# Patient Record
Sex: Male | Born: 2000 | Hispanic: Refuse to answer | Marital: Single | Smoking: Never smoker
Health system: Southern US, Community
[De-identification: ages and names within clinical notes are randomized; demographics above are authoritative.]

## PROBLEM LIST (undated history)

## (undated) ENCOUNTER — Inpatient Hospital Stay: Payer: Self-pay | Admitting: Pediatrics

## (undated) DIAGNOSIS — I319 Disease of pericardium, unspecified: Secondary | ICD-10-CM

## (undated) DIAGNOSIS — I514 Myocarditis, unspecified: Secondary | ICD-10-CM

---
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
## 2018-08-24 ENCOUNTER — Other Ambulatory Visit: Payer: Self-pay

## 2018-08-24 DIAGNOSIS — Z20822 Contact with and (suspected) exposure to covid-19: Secondary | ICD-10-CM

## 2018-08-25 LAB — NOVEL CORONAVIRUS, NAA: SARS-CoV-2, NAA: NOT DETECTED

## 2018-11-10 ENCOUNTER — Other Ambulatory Visit: Payer: Self-pay

## 2018-11-10 DIAGNOSIS — Z20822 Contact with and (suspected) exposure to covid-19: Secondary | ICD-10-CM

## 2018-11-12 LAB — NOVEL CORONAVIRUS, NAA: SARS-CoV-2, NAA: NOT DETECTED

## 2019-01-18 ENCOUNTER — Ambulatory Visit: Payer: 59 | Attending: Internal Medicine

## 2019-01-18 DIAGNOSIS — Z20822 Contact with and (suspected) exposure to covid-19: Secondary | ICD-10-CM

## 2019-01-19 LAB — NOVEL CORONAVIRUS, NAA: SARS-CoV-2, NAA: NOT DETECTED

## 2019-01-27 ENCOUNTER — Ambulatory Visit: Payer: 59 | Attending: Internal Medicine

## 2019-01-27 DIAGNOSIS — Z20822 Contact with and (suspected) exposure to covid-19: Secondary | ICD-10-CM

## 2019-01-28 ENCOUNTER — Other Ambulatory Visit: Payer: 59

## 2019-01-28 LAB — NOVEL CORONAVIRUS, NAA: SARS-CoV-2, NAA: NOT DETECTED

## 2019-04-09 ENCOUNTER — Ambulatory Visit: Payer: 59

## 2019-04-09 ENCOUNTER — Ambulatory Visit: Payer: 59 | Attending: Internal Medicine

## 2019-04-09 DIAGNOSIS — Z23 Encounter for immunization: Secondary | ICD-10-CM

## 2019-04-09 NOTE — Progress Notes (Signed)
   Covid-19 Vaccination Clinic  Name:  MANASES ETCHISON    MRN: [id number] DOB: [birth year]  04/09/2019  Mr. Steidle was observed post Covid-19 immunization for 15 minutes without incident. He was provided with Vaccine Information Sheet and instruction to access the V-Safe system.   Mr. Printup was instructed to call 911 with any severe reactions post vaccine: Marland Kitchen Difficulty breathing  . Swelling of face and throat  . A fast heartbeat  . A bad rash all over body  . Dizziness and weakness   Immunizations Administered    Name Date Dose VIS Date Route   Pfizer COVID-19 Vaccine 04/09/2019 10:05 AM 0.3 mL 12/25/2018 Intramuscular   Manufacturer: [employer], [employer]   Lot: [id number]   NDC: [address]-8

## 2019-05-03 ENCOUNTER — Ambulatory Visit: Payer: Self-pay

## 2019-05-04 ENCOUNTER — Ambulatory Visit: Payer: Self-pay

## 2019-05-13 ENCOUNTER — Ambulatory Visit: Payer: 59

## 2019-06-09 ENCOUNTER — Ambulatory Visit: Payer: 59 | Attending: Internal Medicine

## 2019-06-09 DIAGNOSIS — Z23 Encounter for immunization: Secondary | ICD-10-CM

## 2019-06-09 NOTE — Progress Notes (Signed)
   Covid-19 Vaccination Clinic  Name:  DAQUAWN SEELMAN    MRN: [id number] DOB: 03-24-2000  06/09/2019  Mr. Culbreath was observed post Covid-19 immunization for 15 minutes without incident. He was provided with Vaccine Information Sheet and instruction to access the V-Safe system.   Mr. Ellner was instructed to call 911 with any severe reactions post vaccine: Marland Kitchen Difficulty breathing  . Swelling of face and throat  . A fast heartbeat  . A bad rash all over body  . Dizziness and weakness   Immunizations Administered    Name Date Dose VIS Date Route   Pfizer COVID-19 Vaccine 06/09/2019  4:15 PM 0.3 mL 03/10/2018 Intramuscular   Manufacturer: [employer], [employer]   Lot: [id number]   NDC: [address]-0

## 2019-08-13 ENCOUNTER — Encounter (HOSPITAL_COMMUNITY): Payer: Self-pay | Admitting: Emergency Medicine

## 2019-08-13 ENCOUNTER — Emergency Department (HOSPITAL_COMMUNITY)
Admission: EM | Admit: 2019-08-13 | Discharge: 2019-08-13 | Disposition: A | Discharge status: Home / Self Care | Payer: 59 | Attending: Emergency Medicine | Admitting: Emergency Medicine

## 2019-08-13 DIAGNOSIS — R1084 Generalized abdominal pain: Secondary | ICD-10-CM | POA: Insufficient documentation

## 2019-08-13 DIAGNOSIS — K920 Hematemesis: Secondary | ICD-10-CM | POA: Insufficient documentation

## 2019-08-13 DIAGNOSIS — Z5321 Procedure and treatment not carried out due to patient leaving prior to being seen by health care provider: Secondary | ICD-10-CM | POA: Diagnosis not present

## 2019-08-13 LAB — COMPREHENSIVE METABOLIC PANEL
ALT: 61 U/L — ABNORMAL HIGH (ref 0–44)
AST: 31 U/L (ref 15–41)
Albumin: 5 g/dL (ref 3.5–5.0)
Alkaline Phosphatase: 95 U/L (ref 38–126)
Anion gap: 15 (ref 5–15)
BUN: 12 mg/dL (ref 6–20)
CO2: 21 mmol/L — ABNORMAL LOW (ref 22–32)
Calcium: 9.5 mg/dL (ref 8.9–10.3)
Chloride: 102 mmol/L (ref 98–111)
Creatinine, Ser: 1.01 mg/dL (ref 0.61–1.24)
GFR calc Af Amer: >60 mL/min (ref >60)
GFR calc non Af Amer: >60 mL/min (ref >60)
Glucose, Bld: 72 mg/dL (ref 70–99)
Potassium: 3.4 mmol/L — ABNORMAL LOW (ref 3.5–5.1)
Sodium: 138 mmol/L (ref 135–145)
Total Bilirubin: 1.6 mg/dL — ABNORMAL HIGH (ref 0.3–1.2)
Total Protein: 8.2 g/dL — ABNORMAL HIGH (ref 6.5–8.1)

## 2019-08-13 LAB — CBC
HCT: 49.9 % (ref 39.0–52.0)
Hemoglobin: 16.7 g/dL (ref 13.0–17.0)
MCH: 29 pg (ref 26.0–34.0)
MCHC: 33.5 g/dL (ref 30.0–36.0)
MCV: 86.8 fL (ref 80.0–100.0)
Platelets: 378 10*3/uL (ref 150–400)
RBC: 5.75 MIL/uL (ref 4.22–5.81)
RDW: 12.4 % (ref 11.5–15.5)
WBC: 13.1 10*3/uL — ABNORMAL HIGH (ref 4.0–10.5)
nRBC: 0 % (ref 0.0–0.2)

## 2019-08-13 LAB — URINALYSIS, ROUTINE W REFLEX MICROSCOPIC
Bacteria, UA: NONE SEEN
Glucose, UA: NEGATIVE mg/dL
Hgb urine dipstick: NEGATIVE
Ketones, ur: 20 mg/dL — AB
Leukocytes,Ua: NEGATIVE
Nitrite: NEGATIVE
Protein, ur: 100 mg/dL — AB
Specific Gravity, Urine: 1.034 — ABNORMAL HIGH (ref 1.005–1.030)
pH: 5 (ref 5.0–8.0)

## 2019-08-13 LAB — LIPASE, BLOOD: Lipase: 19 U/L (ref 11–51)

## 2019-08-13 MED ORDER — SODIUM CHLORIDE 0.9% FLUSH: 3.0000 mL | Freq: Once | INTRAVENOUS | Status: DC

## 2019-08-13 NOTE — ED Triage Notes (Signed)
Sent by [facility] for further eval of gen abd pain, N/V, hematemesis X1 week.

## 2019-08-13 NOTE — ED Notes (Signed)
Pt checked out AMA. 

## 2019-08-14 ENCOUNTER — Encounter: Payer: Self-pay | Admitting: Emergency Medicine

## 2019-08-14 ENCOUNTER — Emergency Department
Admission: EM | Admit: 2019-08-14 | Discharge: 2019-08-14 | Disposition: A | Discharge status: Home / Self Care | Payer: 59 | Attending: Emergency Medicine | Admitting: Emergency Medicine

## 2019-08-14 ENCOUNTER — Other Ambulatory Visit: Payer: Self-pay

## 2019-08-14 DIAGNOSIS — R1012 Left upper quadrant pain: Secondary | ICD-10-CM | POA: Diagnosis not present

## 2019-08-14 DIAGNOSIS — R112 Nausea with vomiting, unspecified: Secondary | ICD-10-CM | POA: Insufficient documentation

## 2019-08-14 DIAGNOSIS — R1110 Vomiting, unspecified: Secondary | ICD-10-CM

## 2019-08-14 DIAGNOSIS — R1011 Right upper quadrant pain: Secondary | ICD-10-CM | POA: Diagnosis not present

## 2019-08-14 DIAGNOSIS — R1010 Upper abdominal pain, unspecified: Secondary | ICD-10-CM

## 2019-08-14 LAB — COMPREHENSIVE METABOLIC PANEL
ALT: 56 U/L — ABNORMAL HIGH (ref 0–44)
AST: 28 U/L (ref 15–41)
Albumin: 5.5 g/dL — ABNORMAL HIGH (ref 3.5–5.0)
Alkaline Phosphatase: 98 U/L (ref 38–126)
Anion gap: 17 — ABNORMAL HIGH (ref 5–15)
BUN: 12 mg/dL (ref 6–20)
CO2: 19 mmol/L — ABNORMAL LOW (ref 22–32)
Calcium: 9.7 mg/dL (ref 8.9–10.3)
Chloride: 100 mmol/L (ref 98–111)
Creatinine, Ser: 0.8 mg/dL (ref 0.61–1.24)
GFR calc Af Amer: >60 mL/min (ref >60)
GFR calc non Af Amer: >60 mL/min (ref >60)
Glucose, Bld: 74 mg/dL (ref 70–99)
Potassium: 3.3 mmol/L — ABNORMAL LOW (ref 3.5–5.1)
Sodium: 136 mmol/L (ref 135–145)
Total Bilirubin: 1.9 mg/dL — ABNORMAL HIGH (ref 0.3–1.2)
Total Protein: 8.8 g/dL — ABNORMAL HIGH (ref 6.5–8.1)

## 2019-08-14 LAB — CBC
HCT: 49.3 % (ref 39.0–52.0)
Hemoglobin: 17.2 g/dL — ABNORMAL HIGH (ref 13.0–17.0)
MCH: 29.6 pg (ref 26.0–34.0)
MCHC: 34.9 g/dL (ref 30.0–36.0)
MCV: 84.7 fL (ref 80.0–100.0)
Platelets: 333 10*3/uL (ref 150–400)
RBC: 5.82 MIL/uL — ABNORMAL HIGH (ref 4.22–5.81)
RDW: 12.2 % (ref 11.5–15.5)
WBC: 9.3 10*3/uL (ref 4.0–10.5)
nRBC: 0 % (ref 0.0–0.2)

## 2019-08-14 LAB — LIPASE, BLOOD: Lipase: 21 U/L (ref 11–51)

## 2019-08-14 MED ORDER — PANTOPRAZOLE SODIUM 20 MG PO TBEC
20.0000 mg | DELAYED_RELEASE_TABLET | Freq: Every day | ORAL | 1 refills | Status: DC
Start: 2019-08-14 — End: 2022-07-31

## 2019-08-14 MED ORDER — ONDANSETRON HCL 4 MG PO TABS: 4.0000 mg | ORAL_TABLET | Freq: Three times a day (TID) | ORAL | 0 refills | Status: DC | PRN

## 2019-08-14 MED ORDER — SUCRALFATE 1 G PO TABS
1.0000 g | ORAL_TABLET | Freq: Four times a day (QID) | ORAL | 0 refills | Status: DC
Start: 2019-08-14 — End: 2022-07-31

## 2019-08-14 MED ORDER — SODIUM CHLORIDE 0.9% FLUSH: 3.0000 mL | Freq: Once | INTRAVENOUS | Status: DC

## 2019-08-14 NOTE — ED Notes (Signed)
Patient declined discharge vital signs. Patient's father is at bedside.

## 2019-08-14 NOTE — ED Triage Notes (Signed)
Pt to ED via POV stating that he has been vomiting blood x 1 week. Pt states that he went to [facility] last night but left because the wait was too long. Pt states that he had blood work and urine done. Pt was sent to [facility] by urgent care because he was seen there and told he had possible liver damage.   Pt reports that he has been vomiting x 2 weeks. Pt states that he is able to keep some fluids down. Pt states that he has vomited 3 times in the last 24 hours. Pt reports constant pain on both sides of upper abdomen. Pt has intermittent nausea as well.

## 2019-08-14 NOTE — Discharge Instructions (Addendum)
Please seek medical attention for any high fevers, chest pain, shortness of breath, change in behavior, persistent vomiting, bloody stool or any other new or concerning symptoms.  

## 2019-08-14 NOTE — ED Provider Notes (Signed)
[facility] Emergency Department Provider Note   ____________________________________________   I have reviewed the triage vital signs and the nursing notes.   HISTORY  Chief Complaint Abdominal Pain and Emesis   History limited by: Not Limited   HPI Tyler Barr is a 19 y.o. male who presents to the emergency department today because of concerns for nausea vomiting and abdominal pain.  Patient states that the symptoms have been going on for roughly 1 week.  He states in the preceding week he had had flulike symptoms.  Has noticed some blood in his vomit. He states that he went to urgent care who performed work-up and had some concerns for possible liver damage.  Did go to [facility], ER yesterday but left before being seen secondary to wait time.  Patient states he is able to keep down small amounts of water.    Records reviewed. Per medical record review patient has a history of myocarditis.  Past Medical History:  Diagnosis Date  . Myocarditis (HCC)   . Pericarditis     Patient Active Problem List   Diagnosis Date Noted  . Bipolar 1 disorder, mixed, moderate (HCC) 07/20/2016  . Chest pain 07/29/2014    History reviewed. No pertinent surgical history.  Prior to Admission medications   Medication Sig Start Date End Date Taking? Authorizing Provider  ARIPiprazole (ABILIFY) 5 MG tablet Take 1 tablet (5 mg total) by mouth daily. 07/25/16   Denzil Magnuson, NP  FLUoxetine (PROZAC) 10 MG capsule Take 1 capsule (10 mg total) by mouth daily. 07/25/16   Denzil Magnuson, NP  hydrOXYzine (ATARAX/VISTARIL) 25 MG tablet Take 1 tablet (25 mg total) by mouth 3 (three) times daily as needed for anxiety. 07/24/16   Denzil Magnuson, NP  Melatonin 10 MG TABS Take 10 mg by mouth at bedtime as needed. sleep    [provider]    Allergies Patient has no known allergies.  No family history on file.  Social History Social History   Tobacco Use  .  Smoking status: Never Smoker  . Smokeless tobacco: Never Used  Substance Use Topics  . Alcohol use: Yes  . Drug use: No    Review of Systems Constitutional: No fever in past week (slight fever 2 weeks ago with flu like illness) Eyes: No visual changes. ENT: No sore throat. Cardiovascular: Denies chest pain. Respiratory: Denies shortness of breath. Gastrointestinal: Positive for abdominal pain, nausea, vomiting. Genitourinary: Negative for dysuria. Musculoskeletal: Negative for back pain. Skin: Negative for rash. Neurological: Negative for headaches, focal weakness or numbness.  ____________________________________________   PHYSICAL EXAM:  VITAL SIGNS: ED Triage Vitals  Enc Vitals Group     BP 08/14/19 1142 (!) 135/77     Pulse Rate 08/14/19 1142 92     Resp 08/14/19 1142 16     Temp 08/14/19 1142 98.6 F (37 C)     Temp Source 08/14/19 1142 Oral     SpO2 08/14/19 1142 98 %     Weight 08/14/19 1140 (!) 296 lb (134.3 kg)     Height 08/14/19 1140 6\' 4"  (1.93 m)     Head Circumference --      Peak Flow --      Pain Score 08/14/19 1139 4   Constitutional: Alert and oriented.  Eyes: Conjunctivae are normal.  ENT      Head: Normocephalic and atraumatic.      Nose: No congestion/rhinnorhea.      Mouth/Throat: Mucous membranes are moist.  Neck: No stridor. Cardiovascular: Normal rate, regular rhythm.  No murmurs, rubs, or gallops. Respiratory: Normal respiratory effort without tachypnea nor retractions. Breath sounds are clear and equal bilaterally. No wheezes/rales/rhonchi. Gastrointestinal: Soft and tender to palpation in RUQ and LUQ of abdomen. No rebound. No guarding. Genitourinary: Deferred Musculoskeletal: Normal range of motion in all extremities. No lower extremity edema. Neurologic:  Normal speech and language. No gross focal neurologic deficits are appreciated.  Skin:  Skin is warm, dry and intact. No rash noted. Psychiatric: Mood and affect are normal.  Speech and behavior are normal. Patient exhibits appropriate insight and judgment.  ____________________________________________    LABS (pertinent positives/negatives)  Lipase 21 CBC wbc 9.3, hgb 17.2, plt 333 CMP na 136, k 3.3, glu 74, cr 0.80, t bili 1.9  ____________________________________________   EKG  None  ____________________________________________    RADIOLOGY  None  ____________________________________________   PROCEDURES  Procedures  ____________________________________________   INITIAL IMPRESSION / ASSESSMENT AND PLAN / ED COURSE  Pertinent labs & imaging results that were available during my care of the patient were reviewed by me and considered in my medical decision making (see chart for details).   Patient presented to the emergency department today because of concerns for nausea vomiting upper abdominal pain.  Symptoms have been going on for roughly 1 week.  On my exam patient does have some tenderness to the abdomen the both right and left upper quadrants.  No rebound or guarding.  Abdomen is soft.  I did discussion with the patient about possible causes of his symptoms.  At this time I doubt significant liver disease.  I do think slight elevation of total bili is likely related to the weeklong history of nausea vomiting decreased oral intake.  No leukocytosis to suggest significant infection or inflammation.  I do think the patient is likely slightly dehydrated.  He is slightly hemoconcentrated.  Additionally potassium is slightly low likely secondary to poor oral intake.  I did offer to give patient IV fluids as well as medication to try to help with his abdominal pain and nausea.  I discussed with patient I would like him to demonstrate that he is able to tolerate p.o.  However patient chose to defer medication here in the emergency department.  He would like to get prescriptions and try medication at home.  Will give patient prescription for antiacid,  sucralfate as well as nausea medication.  Will give patient GI follow-up.  Discussed return precautions.  ____________________________________________   FINAL CLINICAL IMPRESSION(S) / ED DIAGNOSES  Final diagnoses:  Vomiting, intractability of vomiting not specified, presence of nausea not specified, unspecified vomiting type  Pain of upper abdomen     Note: This dictation was prepared with Dragon dictation. Any transcriptional errors that result from this process are unintentional     Phineas Semen, MD 08/14/19 1904

## 2022-07-24 ENCOUNTER — Emergency Department
Admission: EM | Admit: 2022-07-24 | Discharge: 2022-07-25 | Disposition: A | Discharge status: Home / Self Care | Payer: 59 | Attending: Emergency Medicine | Admitting: Emergency Medicine

## 2022-07-24 ENCOUNTER — Emergency Department: Payer: 59

## 2022-07-24 ENCOUNTER — Other Ambulatory Visit: Payer: Self-pay

## 2022-07-24 ENCOUNTER — Encounter: Payer: Self-pay | Admitting: Emergency Medicine

## 2022-07-24 DIAGNOSIS — R45851 Suicidal ideations: Secondary | ICD-10-CM | POA: Insufficient documentation

## 2022-07-24 DIAGNOSIS — R000 Tachycardia, unspecified: Secondary | ICD-10-CM | POA: Insufficient documentation

## 2022-07-24 DIAGNOSIS — F309 Manic episode, unspecified: Secondary | ICD-10-CM | POA: Insufficient documentation

## 2022-07-24 DIAGNOSIS — F3162 Bipolar disorder, current episode mixed, moderate: Secondary | ICD-10-CM | POA: Insufficient documentation

## 2022-07-24 LAB — URINE DRUG SCREEN, QUALITATIVE (ARMC ONLY)
Amphetamines, Ur Screen: NOT DETECTED
Barbiturates, Ur Screen: NOT DETECTED
Benzodiazepine, Ur Scrn: NOT DETECTED
Cannabinoid 50 Ng, Ur ~~LOC~~: POSITIVE — AB
Cocaine Metabolite,Ur ~~LOC~~: NOT DETECTED
MDMA (Ecstasy)Ur Screen: NOT DETECTED
Methadone Scn, Ur: NOT DETECTED
Opiate, Ur Screen: NOT DETECTED
Phencyclidine (PCP) Ur S: NOT DETECTED
Tricyclic, Ur Screen: NOT DETECTED

## 2022-07-24 LAB — CBC
HCT: 45.2 % (ref 39.0–52.0)
Hemoglobin: 15.8 g/dL (ref 13.0–17.0)
MCH: 30.2 pg (ref 26.0–34.0)
MCHC: 35 g/dL (ref 30.0–36.0)
MCV: 86.3 fL (ref 80.0–100.0)
Platelets: 289 10*3/uL (ref 150–400)
RBC: 5.24 MIL/uL (ref 4.22–5.81)
RDW: 11.9 % (ref 11.5–15.5)
WBC: 20.3 10*3/uL — ABNORMAL HIGH (ref 4.0–10.5)
nRBC: 0 % (ref 0.0–0.2)

## 2022-07-24 LAB — COMPREHENSIVE METABOLIC PANEL
ALT: 21 U/L (ref 0–44)
AST: 28 U/L (ref 15–41)
Albumin: 5.2 g/dL — ABNORMAL HIGH (ref 3.5–5.0)
Alkaline Phosphatase: 63 U/L (ref 38–126)
Anion gap: 10 (ref 5–15)
BUN: 7 mg/dL (ref 6–20)
CO2: 20 mmol/L — ABNORMAL LOW (ref 22–32)
Calcium: 9 mg/dL (ref 8.9–10.3)
Chloride: 110 mmol/L (ref 98–111)
Creatinine, Ser: 0.76 mg/dL (ref 0.61–1.24)
GFR, Estimated: >60 mL/min (ref >60)
Glucose, Bld: 107 mg/dL — ABNORMAL HIGH (ref 70–99)
Potassium: 3.3 mmol/L — ABNORMAL LOW (ref 3.5–5.1)
Sodium: 140 mmol/L (ref 135–145)
Total Bilirubin: 0.7 mg/dL (ref 0.3–1.2)
Total Protein: 7.7 g/dL (ref 6.5–8.1)

## 2022-07-24 LAB — ACETAMINOPHEN LEVEL: Acetaminophen (Tylenol), Serum: <10 ug/mL — ABNORMAL LOW (ref 10–30)

## 2022-07-24 LAB — SALICYLATE LEVEL: Salicylate Lvl: <7 mg/dL — ABNORMAL LOW (ref 7.0–30.0)

## 2022-07-24 LAB — ETHANOL: Alcohol, Ethyl (B): 52 mg/dL — ABNORMAL HIGH (ref <10)

## 2022-07-24 MED ORDER — DIPHENHYDRAMINE HCL 25 MG PO CAPS: 50.0000 mg | ORAL_CAPSULE | Freq: Three times a day (TID) | ORAL | Status: DC | PRN

## 2022-07-24 MED ORDER — HYDROXYZINE HCL 25 MG PO TABS: 50.0000 mg | ORAL_TABLET | Freq: Three times a day (TID) | ORAL | Status: DC | PRN

## 2022-07-24 MED ORDER — HALOPERIDOL 5 MG PO TABS: 5.0000 mg | ORAL_TABLET | Freq: Three times a day (TID) | ORAL | Status: DC | PRN

## 2022-07-24 MED ORDER — HALOPERIDOL LACTATE 5 MG/ML IJ SOLN: 5.0000 mg | Freq: Three times a day (TID) | INTRAMUSCULAR | Status: DC | PRN

## 2022-07-24 MED ORDER — LORAZEPAM 2 MG/ML IJ SOLN: 2.0000 mg | Freq: Three times a day (TID) | INTRAMUSCULAR | Status: DC | PRN

## 2022-07-24 MED ORDER — LORAZEPAM 2 MG PO TABS: 2.0000 mg | ORAL_TABLET | Freq: Three times a day (TID) | ORAL | Status: DC | PRN

## 2022-07-24 MED ORDER — DIPHENHYDRAMINE HCL 50 MG/ML IJ SOLN: 50.0000 mg | Freq: Three times a day (TID) | INTRAMUSCULAR | Status: DC | PRN

## 2022-07-24 NOTE — ED Notes (Signed)
INVOLUNTARY with acceptance to [facility] for Thursday 7/11 pending labs

## 2022-07-24 NOTE — ED Notes (Signed)
Patient consented for this writer to obtain blood work and was able to give urine sample.

## 2022-07-24 NOTE — BH Assessment (Addendum)
Patient has been accepted to [facility] on tomm 07/25/22 pending labs. Patient assigned to room 304, bed# 1. Accepting physician is Dr. Sherron Flemings.  Call report to [phone].  Representative was [employer].   ER Staff is aware of it:  Misty Stanley, ER Secretary  Dr. Arnoldo Morale, ER MD  Morrie Sheldon, Patient's Nurse     Patient's Family/Support System [place] [phone]: Dad) has been updated as well.

## 2022-07-24 NOTE — ED Notes (Signed)
Pt to unit, educated on rules and expectation. Denies si, hi, avh. Requests water, tv on to cnn, and bible. Provided with night snack and pt uses restroom. Calm cooperative with staff

## 2022-07-24 NOTE — Consult Note (Signed)
Patient seen by psychiatry. Psychiatric inpatient admission recommended. Note to follow.     Radhika Dershem H. Katty Fretwell, NP  07/24/2022 

## 2022-07-24 NOTE — ED Notes (Signed)
Pt still refusing to let us do blood work, but has consented to letting me do an EKG.

## 2022-07-24 NOTE — ED Provider Notes (Signed)
[facility] Provider Note    Event Date/Time   First MD Initiated Contact with Patient 07/24/22 1510     (approximate)   History   Chief Complaint Psychiatric Evaluation   HPI  Tyler Barr is a 22 y.o. male with past medical history of bipolar disorder and myocarditis who presents to the ED for psychiatric evaluation.  Patient was placed under IVC prior to arrival by his family after he reportedly sent a text to his mother stating that he was going to kill himself.  Patient denies any thoughts of suicide, states he may have sent a message but it was in a joking manner.  When local PD arrived to take patient to the ED, he apparently became aggressive and had to be tackled to the ground.  He now complains of pain at his right wrist but denies any difficulty moving his wrist.  He denies any other medical complaints, denies alcohol or drug use.  He states he has not been taking any medications recently, feels he does not need to be here.      Physical Exam   Triage Vital Signs: ED Triage Vitals [07/24/22 1455]  Enc Vitals Group     BP (!) 151/87     Pulse Rate (!) 135     Resp 18     Temp 99.1 F (37.3 C)     Temp Source Axillary     SpO2 96 %     Weight      Height      Head Circumference      Peak Flow      Pain Score 6     Pain Loc      Pain Edu?      Excl. in GC?     Most recent vital signs: Vitals:   07/24/22 1759 07/24/22 1954  BP: 124/69 130/80  Pulse: (!) 114 (!) 101  Resp: 16 18  Temp: 98 F (36.7 C) 98.7 F (37.1 C)  SpO2: 95% 95%    Constitutional: Alert and oriented. Eyes: Conjunctivae are normal. Head: Atraumatic. Nose: No congestion/rhinnorhea. Mouth/Throat: Mucous membranes are moist.  Cardiovascular: Tachycardic, regular rhythm. Grossly normal heart sounds.  2+ radial pulses bilaterally. Respiratory: Normal respiratory effort.  No retractions. Lungs CTAB. Gastrointestinal: Soft and nontender. No  distention. Musculoskeletal: No lower extremity tenderness nor edema.  Abrasion and edema to right wrist with no bony tenderness, range of motion intact with minimal pain. Neurologic: Hyperactive with pressured speech. No gross focal neurologic deficits are appreciated.    ED Results / Procedures / Treatments   Labs (all labs ordered are listed, but only abnormal results are displayed) Labs Reviewed  COMPREHENSIVE METABOLIC PANEL - Abnormal; Notable for the following components:      Result Value   Potassium 3.3 (*)    CO2 20 (*)    Glucose, Bld 107 (*)    Albumin 5.2 (*)    All other components within normal limits  ETHANOL - Abnormal; Notable for the following components:   Alcohol, Ethyl (B) 52 (*)    All other components within normal limits  SALICYLATE LEVEL - Abnormal; Notable for the following components:   Salicylate Lvl <7.0 (*)    All other components within normal limits  ACETAMINOPHEN LEVEL - Abnormal; Notable for the following components:   Acetaminophen (Tylenol), Serum <10 (*)    All other components within normal limits  CBC - Abnormal; Notable for the following components:   WBC  20.3 (*)    All other components within normal limits  URINE DRUG SCREEN, QUALITATIVE ([facility] ONLY) - Abnormal; Notable for the following components:   Cannabinoid 50 Ng, Ur [place] POSITIVE (*)    All other components within normal limits  URINALYSIS, ROUTINE W REFLEX MICROSCOPIC     EKG  ED ECG REPORT I, Chesley Noon, the attending physician, personally viewed and interpreted this ECG.   Date: 07/24/2022  EKG Time: 19:15  Rate: 106  Rhythm: sinus tachycardia  Axis: Normal  Intervals:none  ST&T Change: None  RADIOLOGY Chest x-ray reviewed and interpreted by me with no infiltrate, edema, or effusion.  PROCEDURES:  Critical Care performed: No  Procedures   MEDICATIONS ORDERED IN ED: Medications  haloperidol (HALDOL) tablet 5 mg (has no administration in time range)     Or  haloperidol lactate (HALDOL) injection 5 mg (has no administration in time range)  LORazepam (ATIVAN) tablet 2 mg (has no administration in time range)    Or  LORazepam (ATIVAN) injection 2 mg (has no administration in time range)  diphenhydrAMINE (BENADRYL) capsule 50 mg (has no administration in time range)    Or  diphenhydrAMINE (BENADRYL) injection 50 mg (has no administration in time range)  hydrOXYzine (ATARAX) tablet 50 mg (has no administration in time range)     IMPRESSION / MDM / ASSESSMENT AND PLAN / ED COURSE  I reviewed the triage vital signs and the nursing notes.                              22 y.o. male with past medical history of bipolar disorder and myocarditis who presents to the ED for psychiatric evaluation after he reported only made suicidal statements to his mother, became aggressive after being placed under IVC.  Patient's presentation is most consistent with acute presentation with potential threat to life or bodily function.  Differential diagnosis includes, but is not limited to, psychosis, mania, suicidal ideation, homicidal ideation, subs abuse, medication noncompliance.  Patient nontoxic-appearing and in no acute distress, vital signs remarkable for tachycardia but otherwise reassuring.  Patient complains only of right wrist pain after having to be restrained by local PD.  He has an abrasion and edema but no evidence of bony injury and we will hold off on x-ray.  He denies any other medical complaints, adamantly refuses blood work at this time.  Do not feel that we can force patient to provide blood against his will but he would benefit from psychiatric evaluation and potential psych admission.  Psych eval pending at this time.  Patient now more agreeable to blood draw, labs remarkable for leukocytosis with no significant anemia, electrolyte abnormality, AKI, LFTs are unremarkable.  He denies any symptoms of infection, EKG shows sinus tachycardia.  Given  leukocytosis and tachycardia, will further assess for infection with chest x-ray and urinalysis, but low suspicion for sepsis as patient is well-appearing with no current complaints.  Patient's heart rate is improving, chest x-ray is unremarkable and urinalysis shows no signs of infection.  Patient may be medically cleared for psychiatric disposition.  The patient has been placed in psychiatric observation due to the need to provide a safe environment for the patient while obtaining psychiatric consultation and evaluation, as well as ongoing medical and medication management to treat the patient's condition.  The patient has been placed under full IVC at this time.       FINAL CLINICAL IMPRESSION(S) / ED  DIAGNOSES   Final diagnoses:  Suicidal ideation     Rx / DC Orders   ED Discharge Orders     None        Note:  This document was prepared using Dragon voice recognition software and may include unintentional dictation errors.   Chesley Noon, MD 07/24/22 [phone]

## 2022-07-24 NOTE — ED Triage Notes (Signed)
Patient to ED via [place] [employer] for psych evaluation. IVC'd by family for sending text to mother regarding suicide stating " I'm killing my self should it be inside or outside?" Patient denying SI at this time.   Attempting to get blood work- patient stating he will make it difficult for staff to get blood at this time and that he does not agree with blood work.

## 2022-07-24 NOTE — BH Assessment (Signed)
PATIENT BED AVAILABLE AT 9AM ON 07/25/22 Communicated the time to ER Secretary Carlene   PER PREVIOUS TTS: Patient has been accepted to [facility] on tomm 07/25/22  Patient assigned to room 304, bed# 1. Accepting physician is Dr. Sherron Flemings.  Call report to [phone].  Representative was [employer].    ER Staff is aware of it:  Misty Stanley, ER Secretary  Dr. Arnoldo Morale, ER MD  Morrie Sheldon, Patient's Nurse

## 2022-07-24 NOTE — Consult Note (Cosign Needed)
[facility] Face-to-Face Psychiatry Consult   Reason for Consult:  SI, Mania  Referring Physician:  Chesley Noon, MD Patient Identification: Tyler Barr MRN:  [id number] Principal Diagnosis: Mania [facility]) Diagnosis:  Principal Problem:   Mania (HCC) Active Problems:   Bipolar 1 disorder, mixed, moderate (HCC)   Suicidal ideation   Total Time spent with patient: 30 minutes  Subjective:   Tyler Barr is a 22 y.o. male patient admitted with "my mom believed I may be at risk of hurting myself."  HPI: Patient seen face to face by this provider, consulted with emergency department physician Dr. Larinda Barr; and chart reviewed on 07/24/22. Tyler Barr, 22 y.o., male with past psychiatric history bipolar disorder and depressive symptoms presented to [facility] ED under involuntarily commitment (IVC) filed by his mother. According to IVC document: The respondent has made threatening comments towards his stepfather.  The respondent has sent his mother numerous takes regarding committing suicide including, but not limited to "I am killing myself.  Should it be inside or outside?",  "Outside once staying the carpet.  Inside it might keep me slightly comfortable.",  And "I do not give a shit either way."  The respondent has also made mention of dieting by dehydration by the end of the month.  On evaluation Tyler Barr admits to sending a text message to his mother stating that he was going to kill himself but minimizes the situation, and attributing it to generational differences in emotional expression.  He denies current suicidal or homicidal ideation and says he is unsure about past suicide attempts.  Patient denies experiencing auditory or visual hallucinations or paranoia and has no past history of trauma, abuse, or neglect.  Patient reports satisfactory appetite and sleeps 9 to 10 hours per night.  He says he is not currently connected with outpatient psychiatry or counseling services. He expresses a  desire to return home to engage in his hobbies, such as playing video games. He denies taking any medications other than occasional Tylenol. He denies access to firearms; says mother has knives locked up.  He lives with his mother and stepfather in [place]; feels safe at home. Patient reports he is currently unemployed but actively seeking employment.  He denies any illicit substance or alcohol use, although he admits to using Delta-8 one to two months ago. BAL 52 on admission. UDS + Cannabinoid. PDMP Review revealed 0 active prescription.  During the evaluation, the patient exhibits hyperverbal, bizarre, and rambling behavior with flight of ideas and appears to be responding to internal stimuli. He extends bilateral wrists for the psych team to see; redness observed to bilateral wrists, and attributed to handcuffs used by law enforcement prior to arriving to the emergency department.  Past Psychiatric History: Bipolar disorder. Multiple inpatient admissions.   Risk to Self:  Yes Risk to Others:  No  Prior Inpatient Therapy:  Multiple Tyler Barr) Prior Outpatient Therapy:  Yes ([facility])  Past Medical History:  Past Medical History:  Diagnosis Date   Myocarditis (HCC)    Pericarditis    History reviewed. No pertinent surgical history. Family History: History reviewed. No pertinent family history. Family Psychiatric  History: No known family history Social History:  Social History   Substance and Sexual Activity  Alcohol Use Yes     Social History   Substance and Sexual Activity  Drug Use No    Social History   Socioeconomic History   Marital status: Single    Spouse name: Not on file  Number of children: Not on file   Years of education: Not on file   Highest education level: Not on file  Occupational History   Not on file  Tobacco Use   Smoking status: Never   Smokeless tobacco: Never  Substance and Sexual Activity   Alcohol use: Yes   Drug use: No   Sexual activity:  Never  Other Topics Concern   Not on file  Social History Narrative   Not on file   Social Determinants of Health   Financial Resource Strain: Not on file  Food Insecurity: Not on file  Transportation Needs: Not on file  Physical Activity: Not on file  Stress: Not on file  Social Connections: Not on file   Additional Social History:    Allergies:  No Known Allergies  Labs:  Results for orders placed or performed during the hospital encounter of 07/24/22 (from the past 48 hour(s))  Ethanol     Status: Abnormal   Collection Time: 07/24/22  7:15 PM  Result Value Ref Range   Alcohol, Ethyl (B) 52 (H) <10 mg/dL    Comment: (NOTE) Lowest detectable limit for serum alcohol is 10 mg/dL.  For medical purposes only. Performed at [facility], [address] [place], [place] [address]   Comprehensive metabolic panel     Status: Abnormal   Collection Time: 07/24/22  7:16 PM  Result Value Ref Range   Sodium 140 135 - 145 mmol/L   Potassium 3.3 (L) 3.5 - 5.1 mmol/L   Chloride 110 98 - 111 mmol/L   CO2 20 (L) 22 - 32 mmol/L   Glucose, Bld 107 (H) 70 - 99 mg/dL    Comment: Glucose reference range applies only to samples taken after fasting for at least 8 hours.   BUN 7 6 - 20 mg/dL   Creatinine, Ser 1.02 0.61 - 1.24 mg/dL   Calcium 9.0 8.9 - 72.5 mg/dL   Total Protein 7.7 6.5 - 8.1 g/dL   Albumin 5.2 (H) 3.5 - 5.0 g/dL   AST 28 15 - 41 U/L   ALT 21 0 - 44 U/L   Alkaline Phosphatase 63 38 - 126 U/L   Total Bilirubin 0.7 0.3 - 1.2 mg/dL   GFR, Estimated >36 >64 mL/min    Comment: (NOTE) Calculated using the CKD-EPI Creatinine Equation (2021)    Anion gap 10 5 - 15    Comment: Performed at [facility], [address] [place], [place] [address]  Salicylate level     Status: Abnormal   Collection Time: 07/24/22  7:16 PM  Result Value Ref Range   Salicylate Lvl <7.0 (L) 7.0 - 30.0 mg/dL    Comment: Performed at [facility], [address] [place], [place] [address]  Acetaminophen level     Status: Abnormal   Collection Time: 07/24/22  7:16 PM  Result Value Ref Range   Acetaminophen (Tylenol), Serum <10 (L) 10 - 30 ug/mL    Comment: (NOTE) Therapeutic concentrations vary significantly. A range of 10-30 ug/mL  may be an effective concentration for many patients. However, some  are best treated at concentrations outside of this range. Acetaminophen concentrations >150 ug/mL at 4 hours after ingestion  and >50 ug/mL at 12 hours after ingestion are often associated with  toxic reactions.  Performed at [facility], [address], [place], [place] [address]   cbc     Status: Abnormal   Collection Time: 07/24/22  7:16 PM  Result Value Ref Range   WBC 20.3 (H) 4.0 - 10.5 K/uL   RBC 5.24 4.22 - 5.81 MIL/uL   Hemoglobin 15.8 13.0 - 17.0 g/dL   HCT 16.1 09.6 - 04.5 %   MCV 86.3 80.0 - 100.0 fL   MCH 30.2 26.0 - 34.0 pg   MCHC 35.0 30.0 - 36.0 g/dL   RDW 40.9 81.1 - 91.4 %   Platelets 289 150 - 400 K/uL   nRBC 0.0 0.0 - 0.2 %    Comment: Performed at [facility], [address], [place], [place] [address]  Urine Drug Screen, Qualitative     Status: Abnormal   Collection Time: 07/24/22  7:18 PM  Result Value Ref Range   Tricyclic, Ur Screen NONE DETECTED NONE DETECTED   Amphetamines, Ur Screen NONE DETECTED NONE DETECTED   MDMA (Ecstasy)Ur Screen NONE DETECTED NONE DETECTED   Cocaine Metabolite,Ur [place] NONE DETECTED NONE DETECTED   Opiate, Ur Screen NONE DETECTED NONE DETECTED   Phencyclidine (PCP) Ur S NONE DETECTED NONE DETECTED   Cannabinoid 50 Ng, Ur  POSITIVE (A) NONE DETECTED   Barbiturates, Ur Screen NONE DETECTED NONE DETECTED   Benzodiazepine, Ur Scrn NONE DETECTED NONE DETECTED   Methadone Scn, Ur NONE DETECTED NONE DETECTED    Comment: (NOTE) Tricyclics + metabolites, urine    Cutoff 1000 ng/mL Amphetamines + metabolites, urine  Cutoff 1000 ng/mL MDMA (Ecstasy), urine               Cutoff 500 ng/mL Cocaine Metabolite, urine          Cutoff 300 ng/mL Opiate + metabolites, urine        Cutoff 300 ng/mL Phencyclidine (PCP), urine         Cutoff 25 ng/mL Cannabinoid, urine                 Cutoff 50 ng/mL Barbiturates + metabolites, urine  Cutoff 200 ng/mL Benzodiazepine, urine              Cutoff 200 ng/mL Methadone, urine                   Cutoff 300 ng/mL  The urine drug screen provides only a preliminary, unconfirmed analytical test result and should not be used for non-medical purposes. Clinical consideration and professional judgment should be applied to any positive drug screen result due to possible interfering substances. A more specific alternate chemical method must be used in order to obtain a confirmed analytical result. Gas chromatography / mass spectrometry (GC/MS) is the preferred confirm atory method. Performed at [facility], [address] [place], [place] [address]     Current Facility-Administered Medications  Medication Dose Route Frequency Provider Last Rate Last Admin   diphenhydrAMINE (BENADRYL) capsule 50 mg  50 mg Oral TID PRN Rayshaun Needle H, NP       Or   diphenhydrAMINE (BENADRYL) injection 50 mg  50 mg Intramuscular TID PRN Ilayda Toda H, NP       haloperidol (HALDOL) tablet 5 mg  5 mg Oral TID PRN Aryan Bello H, NP       Or   haloperidol lactate (HALDOL) injection 5 mg  5 mg Intramuscular TID PRN Marcelis Wissner H, NP       hydrOXYzine (ATARAX) tablet 50 mg  50 mg Oral TID PRN Laportia Carley H, NP       LORazepam (ATIVAN) tablet 2 mg  2 mg Oral TID PRN Kyeshia Zinn H,  NP       Or   LORazepam (ATIVAN) injection 2 mg  2 mg Intramuscular TID PRN Sabryn Preslar H, NP       Current Outpatient Medications  Medication Sig Dispense Refill   ARIPiprazole (ABILIFY) 5 MG tablet Take 1 tablet (5 mg total) by mouth daily. (Patient not taking: Reported on 07/24/2022) 30 tablet 0   FLUoxetine (PROZAC) 10  MG capsule Take 1 capsule (10 mg total) by mouth daily. (Patient not taking: Reported on 07/24/2022) 30 capsule 0   hydrOXYzine (ATARAX/VISTARIL) 25 MG tablet Take 1 tablet (25 mg total) by mouth 3 (three) times daily as needed for anxiety. (Patient not taking: Reported on 07/24/2022) 30 tablet 0   Melatonin 10 MG TABS Take 10 mg by mouth at bedtime as needed. sleep (Patient not taking: Reported on 07/24/2022)     ondansetron (ZOFRAN) 4 MG tablet Take 1 tablet (4 mg total) by mouth every 8 (eight) hours as needed. (Patient not taking: Reported on 07/24/2022) 20 tablet 0   pantoprazole (PROTONIX) 20 MG tablet Take 1 tablet (20 mg total) by mouth daily. 30 tablet 1   sucralfate (CARAFATE) 1 g tablet Take 1 tablet (1 g total) by mouth 4 (four) times daily. (Patient not taking: Reported on 07/24/2022) 60 tablet 0    Musculoskeletal: Strength & Muscle Tone: within normal limits Gait & Station: normal Patient leans: N/A            Psychiatric Specialty Exam:  Presentation  General Appearance: No data recorded Eye Contact:No data recorded Speech:No data recorded Speech Volume:No data recorded Handedness:No data recorded  Mood and Affect  Mood:No data recorded Affect:No data recorded  Thought Process  Thought Processes:No data recorded Descriptions of Associations:No data recorded Orientation:No data recorded Thought Content:No data recorded History of Schizophrenia/Schizoaffective disorder:No  Duration of Psychotic Symptoms:No data recorded Hallucinations:No data recorded Ideas of Reference:No data recorded Suicidal Thoughts:No data recorded Homicidal Thoughts:No data recorded  Sensorium  Memory:No data recorded Judgment:No data recorded Insight:No data recorded  Executive Functions  Concentration:No data recorded Attention Span:No data recorded Recall:No data recorded Fund of Knowledge:No data recorded Language:No data recorded  Psychomotor Activity  Psychomotor  Activity:No data recorded  Assets  Assets:No data recorded  Sleep  Sleep:No data recorded  Physical Exam: Physical Exam Vitals and nursing note reviewed. Exam conducted with a chaperone present.  HENT:     Head: Normocephalic.     Nose: Nose normal.  Cardiovascular:     Rate and Rhythm: Tachycardia present.  Pulmonary:     Effort: Pulmonary effort is normal.  Musculoskeletal:     Cervical back: Normal range of motion.  Neurological:     Mental Status: He is alert and oriented to person, place, and time.  Psychiatric:        Attention and Perception: He does not perceive auditory or visual hallucinations.        Mood and Affect: Mood is anxious. Affect is labile.        Speech: Speech is rapid and pressured and tangential.        Behavior: Behavior is hyperactive.        Thought Content: Thought content is delusional. Thought content is not paranoid. Thought content does not include homicidal or suicidal ideation.        Cognition and Memory: Memory normal.        Judgment: Judgment is impulsive and inappropriate.    Review of Systems  Psychiatric/Behavioral:  The patient is nervous/anxious.  All other systems reviewed and are negative.  Blood pressure 130/80, pulse (!) 101, temperature 98.7 F (37.1 C), temperature source Oral, resp. rate 18, SpO2 95 %. There is no height or weight on file to calculate BMI.  Treatment Plan Summary: Daily contact with patient to assess and evaluate symptoms and progress in treatment, Medication management, and Plan : Inpatient psychiatric admission recommended for safety and stabilization when medically cleared.  BH agitation protocol initiated.  Plan reviewed with ED physician, Dr. Larinda Barr.  Disposition: Recommend psychiatric Inpatient admission when medically cleared.  Norma Fredrickson, NP 07/25/2022 12:13 AM

## 2022-07-24 NOTE — ED Notes (Signed)
Snack provided

## 2022-07-24 NOTE — BH Assessment (Signed)
Comprehensive Clinical Assessment (CCA) Note  07/24/2022 DEMARRIUS Barr [id number]  Tyler Barr, 22 year old male who presents to [facility] ED involuntarily for treatment. Per triage note, Patient to ED via [place] [facility] for psych evaluation. IVC'd by family for sending text to mother regarding suicide stating, " I'm killing myself should it be inside or outside?" Patient denying SI at this time. Attempting to get blood work- patient stating he will make it difficult for staff to get blood at this time and that he does not agree with blood work.   During TTS assessment pt presents alert and oriented x 4, restless but cooperative, and mood-congruent with affect. The pt does not appear to be responding to internal or external stimuli. Neither is the pt presenting with any delusional thinking. Pt verified the information provided to triage RN.   Pt identifies his main complaint to be that his mom overreacted to his text. Patient states he spends most of his time playing video games and his mom due to "generational differences" may have taken what he said seriously. Patient admits to sending text but minimizes the thought of him wanting to harm himself. Patient states he lives with his mom and step-dad, he is not working yet seeking employment and feels safe to return. Patient presents hyperverbal and irritable to being in the ED. Patient denies paranoia and says he is not currently taking any medication. Patient does not have access to any firearms. Patient denies using any illicit substances and alcohol. Patient reports he is getting adequate sleep and eats "3 square meals a day." "I just want to go home and play my video games."    Per Christal, NP, pt is recommended for inpatient psychiatric admission.    Chief Complaint:  Chief Complaint  Patient presents with   Psychiatric Evaluation   Visit Diagnosis:     CCA Screening, Triage and Referral (STR)  Patient Reported Information How did you hear  about [place]? Family/Friend  Referral name: No data recorded Referral phone number: No data recorded  Whom do you see for routine medical problems? No data recorded Practice/Facility Name: No data recorded Practice/Facility Phone Number: No data recorded Name of Contact: No data recorded Contact Number: No data recorded Contact Fax Number: No data recorded Prescriber Name: No data recorded Prescriber Address (if known): No data recorded  What Is the Reason for Your Visit/Call Today? Per IVC paperwork, patient sent mom a text stating he wanted to kill himself.  How Long Has This Been Causing You Problems? <Week  What Do You Feel Would Help You the Most Today? Stress Management; Medication(s)   Have You Recently Been in Any Inpatient Treatment (Hospital/Detox/Crisis Center/28-Day Program)? No data recorded Name/Location of Program/Hospital:No data recorded How Long Were You There? No data recorded When Were You Discharged? No data recorded  Have You Ever Received Services From [facility] Before? No data recorded Who Do You See at [facility]? No data recorded  Have You Recently Had Any Thoughts About Hurting Yourself? No  Are You Planning to Commit Suicide/Harm Yourself At This time? No   Have you Recently Had Thoughts About Hurting Someone Karolee Ohs? No  Explanation: No data recorded  Have You Used Any Alcohol or Drugs in the Past 24 Hours? No  How Long Ago Did You Use Drugs or Alcohol? No data recorded What Did You Use and How Much? No data recorded  Do You Currently Have a Therapist/Psychiatrist? No  Name of Therapist/Psychiatrist: No data recorded  Have You Been Recently Discharged From Any Office Practice or Programs? No  Explanation of Discharge From Practice/Program: No data recorded    CCA Screening Triage Referral Assessment Type of Contact: Face-to-Face  Is this Initial or Reassessment? No data recorded Date Telepsych consult ordered in [facility]:  No data  recorded Time Telepsych consult ordered in [facility]:  No data recorded  Patient Reported Information Reviewed? No data recorded Patient Left Without Being Seen? No data recorded Reason for Not Completing Assessment: No data recorded  Collateral Involvement: None provided   Does Patient Have a Court Appointed Legal Guardian? No data recorded Name and Contact of Legal Guardian: No data recorded If Minor and Not Living with Parent(s), Who has Custody? No data recorded Is CPS involved or ever been involved? No data recorded Is APS involved or ever been involved? No data recorded  Patient Determined To Be At Risk for Harm To Self or Others Based on Review of Patient Reported Information or Presenting Complaint? No  Method: No Plan  Availability of Means: No access or NA  Intent: Vague intent or NA  Notification Required: No need or identified person  Additional Information for Danger to Others Potential: No data recorded Additional Comments for Danger to Others Potential: No data recorded Are There Guns or Other Weapons in Your Home? No data recorded Types of Guns/Weapons: No data recorded Are These Weapons Safely Secured?                            No data recorded Who Could Verify You Are Able To Have These Secured: No data recorded Do You Have any Outstanding Charges, Pending Court Dates, Parole/Probation? No data recorded Contacted To Inform of Risk of Harm To Self or Others: No data recorded  Location of Assessment: [facility] ED   Does Patient Present under Involuntary Commitment? Yes  IVC Papers Initial File Date: No data recorded  [place] of Residence: [place]   Patient Currently Receiving the Following Services: Not Receiving Services   Determination of Need: Emergent (2 hours)   Options For Referral: ED Visit; Inpatient Hospitalization; Medication Management; Outpatient Therapy     CCA Biopsychosocial Intake/Chief Complaint:  No data recorded Current  Symptoms/Problems: No data recorded  Patient Reported Schizophrenia/Schizoaffective Diagnosis in Past: No   Strengths: Patient is able to communicate and verbalize needs.  Preferences: No data recorded Abilities: No data recorded  Type of Services Patient Feels are Needed: No data recorded  Initial Clinical Notes/Concerns: No data recorded  Mental Health Symptoms Depression:   None   Duration of Depressive symptoms: No data recorded  Mania:   N/A   Anxiety:    Difficulty concentrating; Irritability   Psychosis:   None   Duration of Psychotic symptoms: No data recorded  Trauma:   N/A   Obsessions:   Poor insight   Compulsions:   N/A   Inattention:   N/A   Hyperactivity/Impulsivity:   Blurts out answers; Talks excessively   Oppositional/Defiant Behaviors:   N/A   Emotional Irregularity:   Potentially harmful impulsivity   Other Mood/Personality Symptoms:  No data recorded   Mental Status Exam Appearance and self-care  Stature:   Tall   Weight:   Average weight   Clothing:   Casual   Grooming:   Normal   Cosmetic use:   None   Posture/gait:   Bizarre   Motor activity:   Not Remarkable   Sensorium  Attention:  Normal   Concentration:   Focuses on irrelevancies   Orientation:   X5   Recall/memory:   Normal   Affect and Mood  Affect:   Anxious   Mood:   Anxious   Relating  Eye contact:   Normal   Facial expression:   Anxious   Attitude toward examiner:   Cooperative; Guarded; Press photographer and Language  Speech flow:  Pressured; Clear and Coherent   Thought content:   Appropriate to Mood and Circumstances   Preoccupation:   None   Hallucinations:   None   Organization:  No data recorded  [employer] of Knowledge:   Good   Intelligence:   Above Average   Abstraction:   Abstract   Judgement:   Common-sensical   Reality Testing:   Distorted   Insight:   Lacking    Decision Making:   Impulsive   Social Functioning  Social Maturity:   Irresponsible   Social Judgement:   Naive   Stress  Stressors:   Work; Transitions   Coping Ability:   Overwhelmed   Skill Deficits:   Scientist, physiological; Responsibility   Supports:   Family     Religion:    Leisure/Recreation: Leisure / Recreation Do You Have Hobbies?: Yes Leisure and Hobbies: Playing video games  Exercise/Diet: Exercise/Diet Have You Gained or Lost A Significant Amount of Weight in the Past Six Months?: No Do You Follow a Special Diet?: No Do You Have Any Trouble Sleeping?: No   CCA Employment/Education Employment/Work Situation: Employment / Work Situation Employment Situation: Unemployed Has Patient ever Been in Equities trader?: No  Education: Education Is Patient Currently Attending School?: No Did Theme park manager?: Yes What Type of College Degree Do you Have?: Bachelors   CCA Family/Childhood History Family and Relationship History: Family history Marital status: Single Does patient have children?: No  Childhood History:  Childhood History By whom was/is the patient raised?: Both parents  Child/Adolescent Assessment:     CCA Substance Use Alcohol/Drug Use: Alcohol / Drug Use Pain Medications: See MAR Prescriptions: See MAR Over the Counter: See MAR History of alcohol / drug use?: No history of alcohol / drug abuse                         ASAM's:  Six Dimensions of Multidimensional Assessment  Dimension 1:  Acute Intoxication and/or Withdrawal Potential:      Dimension 2:  Biomedical Conditions and Complications:      Dimension 3:  Emotional, Behavioral, or Cognitive Conditions and Complications:     Dimension 4:  Readiness to Change:     Dimension 5:  Relapse, Continued use, or Continued Problem Potential:     Dimension 6:  Recovery/Living Environment:     ASAM Severity Score:    ASAM Recommended Level of Treatment:      Substance use Disorder (SUD)    Recommendations for Services/Supports/Treatments:    DSM5 Diagnoses: Patient Active Problem List   Diagnosis Date Noted   Bipolar 1 disorder, mixed, moderate (HCC) 07/20/2016   Chest pain 07/29/2014    Patient Centered Plan: Patient is on the following Treatment Plan(s):  Anxiety   Referrals to Alternative Service(s): Referred to Alternative Service(s):   Place:   Date:   Time:    Referred to Alternative Service(s):   Place:   Date:   Time:    Referred to Alternative Service(s):   Place:   Date:  Time:    Referred to Alternative Service(s):   Place:   Date:   Time:      @BHCOLLABOFCARE @  Microbiologist, Counselor, LCAS-A

## 2022-07-24 NOTE — ED Notes (Signed)
Patient belongings:  1 blue shirt 1 grey shirt 1 brown slipper 2 grey and red socks 1 plaid underwear

## 2022-07-24 NOTE — ED Notes (Signed)
IVC PENDING  CONSULT ?

## 2022-07-25 ENCOUNTER — Inpatient Hospital Stay (HOSPITAL_COMMUNITY)
Admission: AD | Admit: 2022-07-25 | Discharge: 2022-07-31 | DRG: 885 | Disposition: A | Discharge status: Home / Self Care | Payer: 59 | Source: Intra-hospital | Attending: Psychiatry | Admitting: Psychiatry

## 2022-07-25 ENCOUNTER — Encounter (HOSPITAL_COMMUNITY): Payer: Self-pay

## 2022-07-25 DIAGNOSIS — R45851 Suicidal ideations: Secondary | ICD-10-CM | POA: Diagnosis present

## 2022-07-25 DIAGNOSIS — F1210 Cannabis abuse, uncomplicated: Secondary | ICD-10-CM | POA: Diagnosis present

## 2022-07-25 DIAGNOSIS — F310 Bipolar disorder, current episode hypomanic: Secondary | ICD-10-CM | POA: Diagnosis present

## 2022-07-25 DIAGNOSIS — F309 Manic episode, unspecified: Secondary | ICD-10-CM | POA: Diagnosis not present

## 2022-07-25 DIAGNOSIS — F319 Bipolar disorder, unspecified: Secondary | ICD-10-CM | POA: Diagnosis not present

## 2022-07-25 DIAGNOSIS — Z560 Unemployment, unspecified: Secondary | ICD-10-CM | POA: Diagnosis not present

## 2022-07-25 LAB — URINALYSIS, ROUTINE W REFLEX MICROSCOPIC
Bilirubin Urine: NEGATIVE
Glucose, UA: NEGATIVE mg/dL
Hgb urine dipstick: NEGATIVE
Ketones, ur: NEGATIVE mg/dL
Leukocytes,Ua: NEGATIVE
Nitrite: NEGATIVE
Protein, ur: NEGATIVE mg/dL
Specific Gravity, Urine: 1.009 (ref 1.005–1.030)
pH: 6 (ref 5.0–8.0)

## 2022-07-25 MED ORDER — ONDANSETRON HCL 4 MG PO TABS: 8.0000 mg | ORAL_TABLET | Freq: Three times a day (TID) | ORAL | Status: DC | PRN

## 2022-07-25 MED ORDER — HALOPERIDOL LACTATE 5 MG/ML IJ SOLN: 5.0000 mg | Freq: Three times a day (TID) | INTRAMUSCULAR | Status: DC | PRN

## 2022-07-25 MED ORDER — MAGNESIUM HYDROXIDE 400 MG/5ML PO SUSP: 30.0000 mL | Freq: Every day | ORAL | Status: DC | PRN

## 2022-07-25 MED ORDER — POTASSIUM CHLORIDE CRYS ER 20 MEQ PO TBCR
20.0000 meq | EXTENDED_RELEASE_TABLET | Freq: Once | ORAL | Status: DC
  Filled 2022-07-25: qty 1

## 2022-07-25 MED ORDER — SENNA 8.6 MG PO TABS: 1.0000 | ORAL_TABLET | Freq: Every evening | ORAL | Status: DC | PRN

## 2022-07-25 MED ORDER — POLYETHYLENE GLYCOL 3350 17 G PO PACK: 17.0000 g | PACK | Freq: Every day | ORAL | Status: DC | PRN

## 2022-07-25 MED ORDER — ALUM & MAG HYDROXIDE-SIMETH 200-200-20 MG/5ML PO SUSP: 30.0000 mL | ORAL | Status: DC | PRN

## 2022-07-25 MED ORDER — DIPHENHYDRAMINE HCL 25 MG PO CAPS: 50.0000 mg | ORAL_CAPSULE | Freq: Three times a day (TID) | ORAL | Status: DC | PRN

## 2022-07-25 MED ORDER — LORAZEPAM 1 MG PO TABS: 2.0000 mg | ORAL_TABLET | Freq: Three times a day (TID) | ORAL | Status: DC | PRN

## 2022-07-25 MED ORDER — HYDROXYZINE HCL 25 MG PO TABS: 25.0000 mg | ORAL_TABLET | Freq: Three times a day (TID) | ORAL | Status: DC | PRN

## 2022-07-25 MED ORDER — DIPHENHYDRAMINE HCL 50 MG/ML IJ SOLN: 50.0000 mg | Freq: Three times a day (TID) | INTRAMUSCULAR | Status: DC | PRN

## 2022-07-25 MED ORDER — HALOPERIDOL 5 MG PO TABS: 5.0000 mg | ORAL_TABLET | Freq: Three times a day (TID) | ORAL | Status: DC | PRN

## 2022-07-25 MED ORDER — TRAZODONE HCL 50 MG PO TABS: 50.0000 mg | ORAL_TABLET | Freq: Every evening | ORAL | Status: DC | PRN

## 2022-07-25 MED ORDER — ACETAMINOPHEN 325 MG PO TABS: 650.0000 mg | ORAL_TABLET | Freq: Four times a day (QID) | ORAL | Status: DC | PRN

## 2022-07-25 MED ORDER — BISMUTH SUBSALICYLATE 262 MG PO CHEW: 524.0000 mg | CHEWABLE_TABLET | ORAL | Status: DC | PRN

## 2022-07-25 MED ORDER — TRAZODONE HCL 50 MG PO TABS
50.0000 mg | ORAL_TABLET | Freq: Every day | ORAL | Status: DC
  Filled 2022-07-25 (×8): qty 1

## 2022-07-25 MED ORDER — LORAZEPAM 2 MG/ML IJ SOLN: 2.0000 mg | Freq: Three times a day (TID) | INTRAMUSCULAR | Status: DC | PRN

## 2022-07-25 MED ORDER — POTASSIUM CHLORIDE 20 MEQ PO PACK
20.0000 meq | PACK | Freq: Once | ORAL | Status: DC
  Filled 2022-07-25: qty 1

## 2022-07-25 MED ORDER — HYDROXYZINE HCL 25 MG PO TABS
25.0000 mg | ORAL_TABLET | Freq: Three times a day (TID) | ORAL | Status: DC | PRN
  Filled 2022-07-25: qty 1

## 2022-07-25 MED ORDER — ARIPIPRAZOLE 5 MG PO TABS
5.0000 mg | ORAL_TABLET | Freq: Every day | ORAL | Status: DC
  Administered 2022-07-26: 5 mg via ORAL
  Filled 2022-07-25 (×5): qty 1

## 2022-07-25 NOTE — ED Notes (Addendum)
Pt denied vitals at this time.

## 2022-07-25 NOTE — ED Notes (Signed)
Report called to Donato Schultz, Charity fundraiser at [employer].

## 2022-07-25 NOTE — Group Note (Signed)
Date:  07/25/2022 Time:  12:31 PM  [facility] LCSW Group Therapy Note   Group Date: @GROUPDATE @ Start Time: @GROUPSTARTTIME @ End Time: @GROUPENDTIME @   Type of Therapy/Topic:  Group Therapy:  Emotion Regulation  Participation Level:    Mood:  Description of Group:    The purpose of this group is to assist patients in learning to regulate negative emotions and experience positive emotions. Patients will be guided to discuss ways in which they have been vulnerable to their negative emotions. These vulnerabilities will be juxtaposed with experiences of positive emotions or situations, and patients challenged to use positive emotions to combat negative ones. Special emphasis will be placed on coping with negative emotions in conflict situations, and patients will process healthy conflict resolution skills.  Therapeutic Goals: Patient will identify two positive emotions or experiences to reflect on in order to balance out negative emotions:  Patient will label two or more emotions that they find the most difficult to experience:  Patient will be able to demonstrate positive conflict resolution skills through discussion or role plays:   Summary of Patient Progress:       Therapeutic Modalities:   Cognitive Behavioral Therapy Feelings Identification Dialectical Behavioral Therapy   Majorie Santee M ChriscoGroup Topic/Focus:  Managing Feelings:   The focus of this group is to identify what feelings patients have difficulty handling and develop a plan to handle them in a healthier way upon discharge.    Participation Level:  Did Not Attend  Participation Quality:   Affect:    Cognitive:    Insight:   Engagement in Group:    Modes of Intervention:    Additional Comments:    Memory Dance Caulder Wehner 07/25/2022, 12:31 PM

## 2022-07-25 NOTE — Progress Notes (Signed)
Patient was informed that he had medicines due this afternoon.  Potassium and abilify were declined by patient due to the patient saying "I won't take anything I can't afford each medicine."

## 2022-07-25 NOTE — Group Note (Signed)
Date:  07/25/2022 Time:  9:22 PM  Group Topic/Focus:  Wrap-Up Group:   The focus of this group is to help patients review their daily goal of treatment and discuss progress on daily workbooks.    Participation Level:  Active  Participation Quality:  Appropriate  Affect:  Appropriate  Cognitive:  Appropriate  Insight: Appropriate  Engagement in Group:  Engaged  Modes of Intervention:  Education and Exploration  Additional Comments:  Patient attended and participated in group tonight. He reports that today he took a nap. That was the most important  thing that happen for him  Tyler Barr 07/25/2022, 9:22 PM

## 2022-07-25 NOTE — Progress Notes (Signed)
   07/25/22 2217  Psych Admission Type (Psych Patients Only)  Admission Status Involuntary  Psychosocial Assessment  Patient Complaints Worrying  Eye Contact Fair  Facial Expression Animated  Affect Preoccupied  Speech Rapid  Interaction Assertive  Motor Activity Other (Comment) (WNL)  Appearance/Hygiene Unremarkable  Behavior Characteristics Cooperative  Mood Anxious;Preoccupied  Thought Process  Coherency WDL  Content Preoccupation  Delusions WDL  Perception WDL  Hallucination None reported or observed  Judgment Limited  Confusion None  Danger to Self  Current suicidal ideation? Denies  Danger to Others  Danger to Others None reported or observed   D: Patient in dayroom quietly watching TV. Pt attended evening wrap up group and engaged in discussions. Pt appears anxious but when asked if he wanted to take any medication. Pt reported he can not afford. Explained to pt he did not have to pay for any medication now but still refused.  A: Support and encouragement provided as needed.  R: Patient remains safe on the unit. Plan of care ongoing for safety and stability.

## 2022-07-25 NOTE — ED Notes (Signed)
[place]  [facility]  [place]  called  for  transport  to [facility] 

## 2022-07-25 NOTE — Plan of Care (Signed)
  Problem: Education: Goal: Knowledge of [facility] Education information/materials will improve Outcome: Progressing Goal: Verbalization of understanding the information provided will improve Outcome: Progressing   Problem: Coping: Goal: Ability to verbalize frustrations and anger appropriately will improve Outcome: Progressing   Problem: Health Behavior/Discharge Planning: Goal: Identification of resources available to assist in meeting health care needs will improve Outcome: Progressing   Problem: Activity: Goal: Interest or engagement in activities will improve Outcome: Not Progressing Goal: Sleeping patterns will improve Outcome: Not Progressing

## 2022-07-25 NOTE — ED Notes (Addendum)
Pt taking shower. Pt was given hygiene items and the following, 1 clean top, 1 clean bottom, with 1 pair of disposable underwear.  Pt changed out into clean clothing.  Staff disposed of all shower supplies.   

## 2022-07-25 NOTE — [facility] Suicide Risk Assessment (Signed)
[facility] Admission Suicide Risk Assessment  Nursing information obtained from:    Demographic factors:  Male, Caucasian, Unemployed Current Mental Status:  NA Loss Factors:  Decrease in vocational status Historical Factors:  NA Risk Reduction Factors:  Religious beliefs about death, Positive social support  Total Time spent with patient: 1.5 hours Principal Problem: Bipolar 1 disorder (HCC) Diagnosis:  Principal Problem:   Bipolar 1 disorder (HCC) Active Problems:   Cannabis use disorder, mild, abuse   Subjective Data: patient at present denies passive or active SI  Continued Clinical Symptoms:  Alcohol Use Disorder Identification Test Final Score (AUDIT): 0  CLINICAL FACTORS:   More than one psychiatric diagnosis  Psychiatric Specialty Exam: General Appearance: Fairly Groomed; Casual; Appropriate for Environment    Eye Contact: Good    Speech: Pressured    Volume: Normal    Mood: -- ("I feel completely fine")    Affect: Full Range (somewhat labile)    Thought Content: WDL    Suicidal Thoughts: Suicidal Thoughts: No    Homicidal Thoughts: Homicidal Thoughts: No    Thought Process: Coherent; Goal Directed; Linear    Orientation: Full (Time, Place and Person)      Memory: Immediate Good; Recent Good; Remote Good    Judgment: Poor    Insight: Fair    Concentration: Good    Recall: Good    Fund of Knowledge: Good    Language: Good    Psychomotor Activity: Psychomotor Activity: Normal    Assets: Communication Skills; Desire for Improvement; Resilience; Social Support; Housing; Financial Resources/Insurance    Sleep: Sleep: Poor        Review of Systems Review of Systems  Constitutional: Negative.   Respiratory: Negative.    Cardiovascular: Negative.   Gastrointestinal: Negative.   Genitourinary: Negative.   Neurological:  Positive for tremors.      Blood pressure (!) 145/90, pulse 72, temperature 99.2 F (37.3 C), temperature source Oral, resp.  rate 18, height 6\' 3"  (1.905 m), weight 97.7 kg, SpO2 97%. Body mass index is 26.92 kg/m. Physical Exam Vitals and nursing note reviewed.  Constitutional:      Appearance: Normal appearance.  HENT:     Head: Normocephalic and atraumatic.  Pulmonary:     Effort: Pulmonary effort is normal.  Neurological:     Mental Status: He is alert.     Comments: tremor   COGNITIVE FEATURES THAT CONTRIBUTE TO RISK:  None    SUICIDE RISK:  Acute Risk:  Moderate:  Frequent suicidal ideation with limited intensity, and duration, some specificity in terms of plans, no associated intent, good self-control, limited dysphoria/symptomatology, some risk factors present, and identifiable protective factors, including available and accessible social support.  Chronic Risk:  Moderate:  Frequent suicidal ideation with limited intensity, and duration, some specificity in terms of plans, no associated intent, good self-control, limited dysphoria/symptomatology, some risk factors present, and identifiable protective factors, including available and accessible social support.  PLAN OF CARE: see H&P for full plan of care  I certify that inpatient services furnished can reasonably be expected to improve the patient's condition.   Signed: Augusto Gamble, MD 07/25/2022, 3:54 PM

## 2022-07-25 NOTE — ED Notes (Signed)
Pt awake, requests ice water and tv on. When providing to pt he asked for update. Pt provided update and was irritable and became sarcastic with nurse because admission, going to [facility] in [place], and exaggerated medical debt is in his opinion pointless. Pt educated on plan of care and IVC process as he attempted to tell nurse what would happen in situation. Pt remains in room sitting in bed watching TV

## 2022-07-25 NOTE — Tx Team (Signed)
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                     DISCHARGE CRITERIA:  Adequate post-discharge living arrangements Improved stabilization in mood, thinking, and/or behavior  PRELIMINARY DISCHARGE PLAN: Outpatient therapy Participate in family therapy  PATIENT/FAMILY INVOLVEMENT: This treatment plan has been presented to and reviewed with the patient, Tyler Barr. The patient has been given the opportunity to ask questions and make suggestions.  Karn Pickler, RN 07/25/2022, 1:17 PM

## 2022-07-25 NOTE — Progress Notes (Addendum)
Admission note:  Tyler Barr is a 22 year old male who was transferred to [facility] from [facility]. He was brought to [facility] ED by [facility] Department and IVC'd by mother. Patient sent a suicidal text message to mother stating "I'm killing myself should it be inside or outside?" Patient was diagnosed with MDD at age 48, and has no other medical history.  Patient denies ever having SI, as well denies current SI. Presents with hyperverbal but logical speech with no delusions or paranoia. Denies HI and AVH.   Patient is very preoccupied with the cost of his stay at [facility], refusing to step on the scale at first for fear that there is an additional cost. 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   Patient is cooperative and pleasant once given an explanation. Skin search was conducted with Herbert Seta RN. Of note are swollen wrists and superficial scratches on wrists and shoulder from being "taught a lesson" by the [facility] PD. Patient rates the swelling pain a 4/10. No contraband found. Patient was oriented to the unit and Q 15 minute safety checks were initiated.
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
## 2022-07-25 NOTE — ED Notes (Signed)
EMTALA reviewed by this RN and all required documents are up to date. Pt is ready for transport.  

## 2022-07-26 ENCOUNTER — Other Ambulatory Visit (HOSPITAL_COMMUNITY): Payer: Self-pay

## 2022-07-26 ENCOUNTER — Encounter (HOSPITAL_COMMUNITY): Payer: Self-pay

## 2022-07-26 MED ORDER — NICOTINE 7 MG/24HR TD PT24: 7.0000 mg | MEDICATED_PATCH | Freq: Every day | TRANSDERMAL | Status: DC | PRN

## 2022-07-26 MED ORDER — NICOTINE POLACRILEX 2 MG MT GUM: 2.0000 mg | CHEWING_GUM | OROMUCOSAL | Status: DC | PRN

## 2022-07-26 NOTE — Group Note (Signed)
Recreation Therapy Group Note   Group Topic:Problem Solving  Group Date: 07/26/2022 Start Time: 0930 End Time: 1010 Facilitators: Mikaella Escalona-McCall, LRT,CTRS Location: 300 [address] Dayroom   Goal Area(s) Addresses:  Patient will effectively work with peer towards shared goal.  Patient will identify skills used to make activity successful.  Patient will identify how skills used during activity can be used to reach post d/c goals.    Group Description: Landing Pad. In teams of 3-5, patients were given 12 plastic drinking straws and an equal length of masking tape. Using the materials provided, patients were asked to build a landing pad to catch a golf ball dropped from approximately 5 feet in the air. All materials were required to be used by the team in their design. LRT facilitated post-activity discussion.   Affect/Mood: Appropriate   Participation Level: Engaged   Participation Quality: Independent   Behavior: Appropriate   Speech/Thought Process: Focused   Insight: Good   Judgement: Good   Modes of Intervention: STEM Activity   Patient Response to Interventions:  Engaged   Education Outcome:  Acknowledges education   Clinical Observations/Individualized Feedback: Pt was engaged and focused during group session. Pt worked with peers in coming up with an idea and concept that was able to be successful.     Plan: Continue to engage patient in RT group sessions 2-3x/week.   Christinna Sprung-McCall, LRT,CTRS  07/26/2022 1:11 PM

## 2022-07-26 NOTE — [facility] Suicide Risk Assessment (Signed)
[facility] INPATIENT:  Family/Significant Other Suicide Prevention Education  Suicide Prevention Education:  Education Completed; 07-26-2022,  Tyler Barr [id number]) [phone](father)has been identified by the patient as the father with whom the patient will be residing, and identified as the person(s) who will aid the patient in the event of a mental health crisis (suicidal ideations/suicide attempt).  With written consent from the patient, the family member/significant other has been provided the following suicide prevention education, prior to the and/or following the discharge of the patient.  The suicide prevention education provided includes the following: Suicide risk factors Suicide prevention and interventions National Suicide Hotline telephone number [facility] assessment telephone number [facility] Emergency Assistance 911 [facility] and/or Residential Mobile Crisis Unit telephone number  Request made of family/significant other to: Remove weapons (e.g., guns, rifles, knives), all items previously/currently identified as safety concern.   Remove drugs/medications (over-the-counter, prescriptions, illicit drugs), all items previously/currently identified as a safety concern.  Pamala Hurry [phone]) [phone](father)verbalizes understanding of the suicide prevention education information provided.  The family member/significant other agrees to remove the items of safety concern listed above.  Lyncoln Maskell S Bobbi Kozakiewicz 07/26/2022, 3:27 PM

## 2022-07-26 NOTE — Progress Notes (Signed)
Chaplain received a consult to provide support to Tyler Barr.  He stated that he did not understand why he was being asked to meet with me and that he did not understand what a lot of his treatment is. Chaplain explained role and he stated that he was okay and did not need to meet.
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
## 2022-07-26 NOTE — [facility] Group Notes (Signed)
The focus of this group is to help patients establish daily goals to achieve during treatment and discuss how the patient can incorporate goal setting into their daily lives to aide in recovery.     Scale 1- 10 8 out of 10     Goals: Talk to Doctor about discharge plan. Apologize to mother(already did)

## 2022-07-26 NOTE — Progress Notes (Signed)
Dar Note: Patient presents with anxious affect and paranoid behavior.  Appears pressured, fidgety with poor eye contact.  Denies suicidal thoughts, auditory and visual hallucinations.  Reports good sleep and appetite.  Preoccupied about paying for any of his prescribed  medications and asking for proof before taking his medication.  Social worker and staff educated patient about different forms of payment.  Patient visible in milieu for therapy and activities.  Routine safety checks maintained.  Patient is safe on and off the unit.

## 2022-07-26 NOTE — Plan of Care (Signed)
  Problem: Activity: Goal: Interest or engagement in activities will improve Outcome: Progressing   Problem: Coping: Goal: Ability to verbalize frustrations and anger appropriately will improve Outcome: Progressing   Problem: Education: Goal: Knowledge of [facility] Education information/materials will improve Outcome: Not Progressing Goal: Mental status will improve Outcome: Not Progressing

## 2022-07-26 NOTE — BH IP Treatment Plan (Signed)
Interdisciplinary Treatment and Diagnostic Plan   07/26/2022 Time of Session: 1025 Tyler Barr MRN: [id number]  Principal Diagnosis: Bipolar 1 disorder (HCC)  Secondary Diagnoses: Principal Problem:   Bipolar 1 disorder (HCC) Active Problems:   Cannabis use disorder, mild, abuse   Current Medications:  Current Facility-Administered Medications  Medication Dose Route Frequency Provider Last Rate Last Admin   acetaminophen (TYLENOL) tablet 650 mg  650 mg Oral Q6H PRN Augusto Gamble, MD       alum & mag hydroxide-simeth (MAALOX/MYLANTA) 200-200-20 MG/5ML suspension 30 mL  30 mL Oral Q4H PRN Augusto Gamble, MD       ARIPiprazole (ABILIFY) tablet 5 mg  5 mg Oral Daily Augusto Gamble, MD   5 mg at 07/26/22 1011   bismuth subsalicylate (PEPTO BISMOL) chewable tablet 524 mg  524 mg Oral Q3H PRN Augusto Gamble, MD       diphenhydrAMINE (BENADRYL) capsule 50 mg  50 mg Oral TID PRN Onuoha, Chinwendu V, NP       Or   diphenhydrAMINE (BENADRYL) injection 50 mg  50 mg Intramuscular TID PRN Onuoha, Chinwendu V, NP       haloperidol (HALDOL) tablet 5 mg  5 mg Oral TID PRN Onuoha, Chinwendu V, NP       Or   haloperidol lactate (HALDOL) injection 5 mg  5 mg Intramuscular TID PRN Onuoha, Chinwendu V, NP       hydrOXYzine (ATARAX) tablet 25 mg  25 mg Oral TID PRN Augusto Gamble, MD       LORazepam (ATIVAN) tablet 2 mg  2 mg Oral TID PRN Onuoha, Chinwendu V, NP       Or   LORazepam (ATIVAN) injection 2 mg  2 mg Intramuscular TID PRN Onuoha, Chinwendu V, NP       nicotine (NICODERM CQ - dosed in mg/24 hr) patch 7 mg  7 mg Transdermal Daily PRN Augusto Gamble, MD       And   nicotine polacrilex (NICORETTE) gum 2 mg  2 mg Oral PRN Augusto Gamble, MD       ondansetron (ZOFRAN) tablet 8 mg  8 mg Oral Q8H PRN Augusto Gamble, MD       polyethylene glycol (MIRALAX / GLYCOLAX) packet 17 g  17 g Oral Daily PRN Augusto Gamble, MD       potassium chloride SA (KLOR-CON M) CR tablet 20 mEq  20 mEq Oral Once Augusto Gamble, MD       Or    potassium chloride (KLOR-CON) packet 20 mEq  20 mEq Oral Once Augusto Gamble, MD       senna (SENOKOT) tablet 8.6 mg  1 tablet Oral QHS PRN Augusto Gamble, MD       traZODone (DESYREL) tablet 50 mg  50 mg Oral QHS Augusto Gamble, MD       PTA Medications: Medications Prior to Admission  Medication Sig Dispense Refill Last Dose   ARIPiprazole (ABILIFY) 5 MG tablet Take 1 tablet (5 mg total) by mouth daily. (Patient not taking: Reported on 07/24/2022) 30 tablet 0    FLUoxetine (PROZAC) 10 MG capsule Take 1 capsule (10 mg total) by mouth daily. (Patient not taking: Reported on 07/24/2022) 30 capsule 0    hydrOXYzine (ATARAX/VISTARIL) 25 MG tablet Take 1 tablet (25 mg total) by mouth 3 (three) times daily as needed for anxiety. (Patient not taking: Reported on 07/24/2022) 30 tablet 0    Melatonin 10 MG TABS Take 10 mg by mouth at bedtime as  needed. sleep (Patient not taking: Reported on 07/24/2022)      ondansetron (ZOFRAN) 4 MG tablet Take 1 tablet (4 mg total) by mouth every 8 (eight) hours as needed. (Patient not taking: Reported on 07/24/2022) 20 tablet 0    pantoprazole (PROTONIX) 20 MG tablet Take 1 tablet (20 mg total) by mouth daily. 30 tablet 1    sucralfate (CARAFATE) 1 g tablet Take 1 tablet (1 g total) by mouth 4 (four) times daily. (Patient not taking: Reported on 07/24/2022) 60 tablet 0     Patient Stressors: Financial difficulties   Marital or family conflict   Occupational concerns    Patient Strengths: Ability for insight  Average or above average intelligence  Capable of independent living  Forensic psychologist fund of knowledge  Motivation for treatment/growth  Supportive family/friends  Work skills   Treatment Modalities: Medication Management, Group therapy, Case management,  1 to 1 session with clinician, Psychoeducation, Recreational therapy.   Physician Treatment Plan for Primary Diagnosis: Bipolar 1 disorder (HCC) Long Term Goal(s):     Short Term Goals:     Medication Management: Evaluate patient's response, side effects, and tolerance of medication regimen.  Therapeutic Interventions: 1 to 1 sessions, Unit Group sessions and Medication administration.  Evaluation of Outcomes: Not Met  Physician Treatment Plan for Secondary Diagnosis: Principal Problem:   Bipolar 1 disorder (HCC) Active Problems:   Cannabis use disorder, mild, abuse  Long Term Goal(s):     Short Term Goals:       Medication Management: Evaluate patient's response, side effects, and tolerance of medication regimen.  Therapeutic Interventions: 1 to 1 sessions, Unit Group sessions and Medication administration.  Evaluation of Outcomes: Not Met   RN Treatment Plan for Primary Diagnosis: Bipolar 1 disorder (HCC) Long Term Goal(s): Knowledge of disease and therapeutic regimen to maintain health will improve  Short Term Goals: Ability to remain free from injury will improve, Ability to verbalize frustration and anger appropriately will improve, Ability to demonstrate self-control, Ability to participate in decision making will improve, Ability to verbalize feelings will improve, Ability to disclose and discuss suicidal ideas, Ability to identify and develop effective coping behaviors will improve, and Compliance with prescribed medications will improve  Medication Management: RN will administer medications as ordered by provider, will assess and evaluate patient's response and provide education to patient for prescribed medication. RN will report any adverse and/or side effects to prescribing provider.  Therapeutic Interventions: 1 on 1 counseling sessions, Psychoeducation, Medication administration, Evaluate responses to treatment, Monitor vital signs and CBGs as ordered, Perform/monitor CIWA, COWS, AIMS and Fall Risk screenings as ordered, Perform wound care treatments as ordered.  Evaluation of Outcomes: Not Met   LCSW Treatment Plan for Primary Diagnosis: Bipolar 1  disorder (HCC) Long Term Goal(s): Safe transition to appropriate next level of care at discharge, Engage patient in therapeutic group addressing interpersonal concerns.  Short Term Goals: Engage patient in aftercare planning with referrals and resources, Increase social support, Increase ability to appropriately verbalize feelings, Increase emotional regulation, Facilitate acceptance of mental health diagnosis and concerns, Facilitate patient progression through stages of change regarding substance use diagnoses and concerns, Identify triggers associated with mental health/substance abuse issues, and Increase skills for wellness and recovery  Therapeutic Interventions: Assess for all discharge needs, 1 to 1 time with Social worker, Explore available resources and support systems, Assess for adequacy in community support network, Educate family and significant other(s) on suicide prevention, Complete Psychosocial Assessment, Interpersonal group therapy.  Evaluation of Outcomes: Not Met   Progress in Treatment: Attending groups: Yes. Participating in groups: Yes. Taking medication as prescribed: Yes. Toleration medication: Yes. Family/Significant other contact made: Yes, individual(s) contacted:  Father Casimiro Needle Patient understands diagnosis: No. Discussing patient identified problems/goals with staff: Yes. Medical problems stabilized or resolved: Yes. Denies suicidal/homicidal ideation: Yes. Issues/concerns per patient self-inventory: Yes. Other: N/A  New problem(s) identified: No, Describe:  None Reported  New Short Term/Long Term Goal(s): medication stabilization, elimination of SI thoughts, development of comprehensive mental wellness plan.   Patient Goals:  Medication Stabilization  Discharge Plan or Barriers: :  Patient recently admitted. CSW will continue to follow and assess for appropriate referrals and possible discharge planning.   Reason for Continuation of Hospitalization:  Anxiety Mania Medication stabilization Withdrawal symptoms  Estimated Length of Stay: 5-7 Days  Last 3 [place] Suicide Severity Risk Score: Flowsheet Row [facility] (Current[facility] from 07[facility]24/2022 in [facility] Emergency Department at [facility]  C-SSRS RISK CATEGORY No Risk Low Risk       Last PHQ 2/9 Scores:     No data to display        detox, medication management for mood stabilization; elimination of SI thoughts; development of comprehensive mental wellness/sobriety plan      Scribe for Treatment Team: Ane Payment, LCSW 07/26/2022 11:39 AM

## 2022-07-26 NOTE — Hospital Course (Signed)
Reason for admission: 22 y.o. male admitted for suicidal threats Psychiatric diagnoses: bipolar I disorder, cannabis use disorder Psychotropic medications: aripiprazole, trazodone Medical diagnoses: none Hospital course: admitted 7/11 and presenting hypo/manic. Was initially refusing to take medications due to concerns about cost but has since been taking meds Disposition: TBD. Either back to mom, to live with dad, or shelter. Will need to clarify closer to d/c  To do over the weekend: plan is to increase aripiprazole tomorrow to 10 mg. Will need to assess patient for improvement in hypo/manic symptoms. If patient refuses can consider non-emergency forced meds
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
## 2022-07-26 NOTE — Plan of Care (Signed)
  Problem: Education: Goal: Mental status will improve Outcome: Not Progressing   

## 2022-07-26 NOTE — Progress Notes (Signed)
Patient ID: Tyler Barr, male   DOB: Dec 30, 2000, 22 y.o.   MRN: [id number] Pt refused lab this morning. Pt reports he is not able to afford the bill. Another nurse was able to convince him to get out of bed to the lab room. Pt sat down but stated he needed to see a written agreement that he will sign before the procedure. Pt refused and walked out. Pt also refused vital signs for the same reason.

## 2022-07-27 MED ORDER — ARIPIPRAZOLE 10 MG PO TABS
10.0000 mg | ORAL_TABLET | Freq: Every day | ORAL | Status: DC
  Administered 2022-07-27: 10 mg via ORAL
  Filled 2022-07-27 (×3): qty 1

## 2022-07-27 NOTE — [facility] Group Notes (Signed)
Adult Psychoeducational Group Note  Date:  07/27/2022 Time:  9:39 PM  Group Topic/Focus:  Wrap-Up Group:   The focus of this group is to help patients review their daily goal of treatment and discuss progress on daily workbooks.  Participation Level:  Active  Participation Quality:  Appropriate  Affect:  Appropriate  Cognitive:  Appropriate  Insight: Appropriate  Engagement in Group:  Engaged  Modes of Intervention:  Education  Additional Comments:    Pt goal today was to comply with Doctors orders.Pt rated his day an 8.  Tomorrow pt wants to work on enjoying his day.  Tyler Barr, Tyler Barr 07/27/2022, 9:39 PM

## 2022-07-27 NOTE — Progress Notes (Signed)
Pt denied SI/HI/AVH this morning. Pt complains of depression this morning, stating "It is hard to get out of bed this morning because I feel so depressed". Patient compliant with medication administration, RN assessed for cheeking per MD order. Patient has questions regarding prescribed Abilify, MD notified. Pt has been cooperative throughout the shift. RN provided support and encouragement to patient. Pt given scheduled medications as prescribed. Q15 min checks verified for safety. Patient verbally contracts for safety. Patient compliant with medications and treatment plan. Patient is interacting well on the unit. Pt is safe on the unit.   07/27/22 0948  Psych Admission Type (Psych Patients Only)  Admission Status Involuntary  Psychosocial Assessment  Patient Complaints Depression ("I am so depressed that it is hard to get out of bed")  Eye Contact Fair  Facial Expression Animated  Affect Apprehensive;Preoccupied  Speech Logical/coherent  Interaction Assertive  Motor Activity Other (Comment) (WDL)  Appearance/Hygiene Unremarkable  Behavior Characteristics Appropriate to situation  Mood Apprehensive;Anxious  Thought Process  Coherency WDL  Content Preoccupation  Delusions None reported or observed  Perception WDL  Hallucination None reported or observed  Judgment Poor  Confusion None  Danger to Self  Current suicidal ideation? Denies  Danger to Others  Danger to Others None reported or observed

## 2022-07-27 NOTE — Group Note (Signed)
Date:  07/27/2022 Time:  4:31 PM  Group Topic/Focus:  Goals Group:   The focus of this group is to help patients establish daily goals to achieve during treatment and discuss how the patient can incorporate goal setting into their daily lives to aide in recovery. Orientation:   The focus of this group is to educate the patient on the purpose and policies of crisis stabilization and provide a format to answer questions about their admission.  The group details unit policies and expectations of patients while admitted.    Participation Level:  Active  Participation Quality:  Attentive  Affect:  Appropriate  Cognitive:  Appropriate  Insight: Appropriate  Engagement in Group:  Engaged  Modes of Intervention:  Discussion, Orientation, and Support  Additional Comments:   Pt attended and participated in the Orientation/Goals group.   Edmund Hilda Cassidy Tashiro 07/27/2022, 4:31 PM

## 2022-07-27 NOTE — Progress Notes (Signed)
   07/27/22 0534  15 Minute Checks  Location Bedroom  Visual Appearance Calm  Behavior Sleeping  Sleep (Behavioral Health Patients Only)  Calculate sleep? (Click Yes once per 24 hr at 0600 safety check) Yes  Documented sleep last 24 hours 7.75    
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
## 2022-07-27 NOTE — Progress Notes (Signed)
   07/27/22 2200  Psych Admission Type (Psych Patients Only)  Admission Status Involuntary  Psychosocial Assessment  Patient Complaints None  Eye Contact Fair  Facial Expression Animated  Affect Appropriate to circumstance  Speech Logical/coherent  Interaction Assertive  Motor Activity Fidgety  Appearance/Hygiene Unremarkable  Behavior Characteristics Appropriate to situation  Mood Pleasant  Thought Process  Coherency WDL  Content Preoccupation  Delusions None reported or observed  Perception WDL  Hallucination None reported or observed  Judgment Poor  Confusion None  Danger to Self  Current suicidal ideation? Denies  Danger to Others  Danger to Others None reported or observed

## 2022-07-27 NOTE — Group Note (Signed)
Date:  07/27/2022 Time:  6:33 PM  Group Topic/Focus:  Social Wellness    Participation Level:  Active  Participation Quality:  Attentive  Affect:  Appropriate  Cognitive:  Appropriate  Insight: Appropriate  Engagement in Group:  Engaged  Modes of Intervention:  Education  Additional Comments:   Pt attended and participated in the Social Wellness group.  Edmund Hilda Demetric Dunnaway 07/27/2022, 6:33 PM

## 2022-07-27 NOTE — Plan of Care (Signed)
  Problem: Education: Goal: Emotional status will improve Outcome: Progressing Goal: Mental status will improve Outcome: Progressing Goal: Verbalization of understanding the information provided will improve Outcome: Progressing   Problem: Coping: Goal: Ability to verbalize frustrations and anger appropriately will improve Outcome: Progressing   Problem: Health Behavior/Discharge Planning: Goal: Compliance with treatment plan for underlying cause of condition will improve Outcome: Progressing   

## 2022-07-27 NOTE — Group Note (Signed)
Date:  07/27/2022 Time:  5:41 AM  Group Topic/Focus:  Wrap-Up Group:   The focus of this group is to help patients review their daily goal of treatment and discuss progress on daily workbooks.  Pt participated in [place].   Participation Level:  Active  Participation Quality:  Appropriate  Affect:  Appropriate  Cognitive:  Appropriate  Insight: Improving  Engagement in Group:  Engaged  Modes of Intervention:  Support  Additional Comments:    Osa Craver 07/27/2022, 5:41 AM
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
## 2022-07-27 NOTE — [facility] Group Notes (Signed)
[facility] Group Notes:  (Nursing/MHT/Case Management/Adjunct)  Date:  07/27/2022  Time:  2:19 PM  Type of Therapy:  Psychoeducational Skills  Participation Level:  Active  Participation Quality:  Appropriate  Affect:  Appropriate  Cognitive:  Alert and Appropriate  Insight:  Appropriate  Engagement in Group:  Engaged  Modes of Intervention:  Discussion, Education, and Exploration  Summary of Progress/Problems: Psychoeducational group with RN in which patients were given two poems to use, one '' The owl and the chimpanzee'' and ''watch your thoughts they become actions '' . Both poems focus on identifying negative behavioral actions and identifying healthy coping skills healthy behaviors to help incorporate for improved mental health.   Tyler Barr 07/27/2022, 2:19 PM

## 2022-07-28 MED ORDER — ARIPIPRAZOLE 10 MG PO TABS
10.0000 mg | ORAL_TABLET | Freq: Every day | ORAL | Status: DC
  Administered 2022-07-28 – 2022-07-31 (×4): 10 mg via ORAL
  Filled 2022-07-28 (×5): qty 1

## 2022-07-28 MED ORDER — ARIPIPRAZOLE 15 MG PO TABS
15.0000 mg | ORAL_TABLET | Freq: Every day | ORAL | Status: DC
  Filled 2022-07-28 (×3): qty 1

## 2022-07-28 MED ORDER — ARIPIPRAZOLE 10 MG PO TABS: 10.0000 mg | ORAL_TABLET | Freq: Every day | ORAL | Status: DC

## 2022-07-28 NOTE — Progress Notes (Signed)
   07/28/22 2149  Psych Admission Type (Psych Patients Only)  Admission Status Involuntary  Psychosocial Assessment  Patient Complaints Anxiety  Eye Contact Fair  Facial Expression Animated  Affect Appropriate to circumstance  Speech Logical/coherent  Interaction Assertive  Motor Activity Fidgety  Appearance/Hygiene Unremarkable  Behavior Characteristics Guarded  Mood Preoccupied  Thought Process  Coherency WDL  Content Preoccupation  Delusions None reported or observed  Perception WDL  Hallucination None reported or observed  Judgment Poor  Confusion None  Danger to Self  Current suicidal ideation? Denies  Danger to Others  Danger to Others None reported or observed

## 2022-07-28 NOTE — Progress Notes (Signed)
D:  Patient denied SI and HI, contracts for safety.  Denied A/V hallucinations   A:  Patient refused abilify this morning.  He stated that  the abilify was being increased and was not helping him feel better.  Will discuss medications with MD today. R:  Safety maintained with 15 minute checks.

## 2022-07-28 NOTE — Plan of Care (Signed)
Nurse discussed anxiety, depression and coping skills with patient.  

## 2022-07-28 NOTE — Plan of Care (Signed)
  Problem: Education: Goal: Knowledge of [facility] Education information/materials will improve Outcome: Progressing Goal: Mental status will improve Outcome: Progressing   Problem: Activity: Goal: Interest or engagement in activities will improve Outcome: Progressing Goal: Sleeping patterns will improve Outcome: Progressing   Problem: Coping: Goal: Ability to verbalize frustrations and anger appropriately will improve Outcome: Progressing Goal: Ability to demonstrate self-control will improve Outcome: Progressing

## 2022-07-28 NOTE — [facility] Group Notes (Signed)
[facility] Group Notes:  (Nursing/MHT/Case Management/Adjunct)  Date:  07/28/2022  Time:  2000  Type of Therapy:   Wrap up group  Participation Level:  Active  Participation Quality:  Appropriate, Attentive, Sharing, and Supportive  Affect:  Appropriate  Cognitive:  Alert  Insight:  Limited  Engagement in Group:  Developing/Improving  Modes of Intervention:  Clarification, Education, and Support  Summary of Progress/Problems: Positive thinking and positive change were discussed.   Tyler Barr 07/28/2022, 9:26 PM

## 2022-07-28 NOTE — Group Note (Signed)
Date:  07/28/2022 Time:  4:40 PM  Group Topic/Focus:  Building Self Esteem:   The Focus of this group is helping patients become aware of the effects of self-esteem on their lives, the things they and others do that enhance or undermine their self-esteem, seeing the relationship between their level of self-esteem and the choices they make and learning ways to enhance self-esteem.    Participation Level:  Active  Participation Quality:  Appropriate  Affect:  Appropriate  Cognitive:  Appropriate  Insight: Appropriate  Engagement in Group:  Engaged  Modes of Intervention:  Education  Additional Comments:   This group was based on building  self-esteem while  using the micro phones to state one thing I use to do, and and what I do now.the courage to speak out. PT decide to tell a joke after.  Gwinda Maine 07/28/2022, 4:40 PM
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
## 2022-07-28 NOTE — Group Note (Signed)
Date:  07/28/2022 Time:  9:58 AM  Group Topic/Focus:  Goals Group:   The focus of this group is to help patients establish daily goals to achieve during treatment and discuss how the patient can incorporate goal setting into their daily lives to aide in recovery.    Participation Level:  Active  Participation Quality:  Appropriate  Affect:  Appropriate  Cognitive:  Appropriate  Insight: Appropriate  Engagement in Group:  Engaged  Modes of Intervention:  Education  Additional Comments:  PT participated in positive affirmation discussion  Gwinda Maine 07/28/2022, 9:58 AM

## 2022-07-28 NOTE — [facility] Group Notes (Signed)
[facility] Group Notes:  (Nursing/MHT/Case Management/Adjunct)  Date:  07/28/2022  Time:  11:04 AM  Type of Therapy:  Psychoeducational Skills  Participation Level:  Active  Participation Quality:  Appropriate and Attentive  Affect:  Appropriate  Cognitive:  Alert and Appropriate  Insight:  Appropriate  Engagement in Group:  Engaged  Modes of Intervention:  Discussion, Education, and Exploration  Summary of Progress/Problems: Patients were given education on identifying warning signs of acute mental health decompensation. They were asked to reflect on recognizing their own signs before hospitalization and identify healthy coping skills to integrate to promote mental wellbeing. Themes of symbolism used to help patients identify how mental wellbeing is integral to daily life, and no one piece can operate alone with car analogy. Patient attended and participated.   Malva Limes 07/28/2022, 11:04 AM

## 2022-07-29 NOTE — [facility] Group Notes (Signed)
Pt attended AA group 

## 2022-07-29 NOTE — Group Note (Signed)
Recreation Therapy Group Note   Group Topic:Health and Wellness  Group Date: 07/29/2022 Start Time: 0935 End Time: 1016 Facilitators: Blanton Kardell-McCall, LRT,CTRS Location: [address] Dayroom   Goal Area(s) Addresses:  Patient will define components of whole wellness. Patient will verbalize benefit of whole wellness.  Group Description: Exercise. LRT and patients discussed the importance of good physical health. LRT explained the activity to patients. Patients were informed they were going to take turns leading the group in the exercises of their choosing. Patients were to complete at least 20-30 minutes of exercise. Patients were encouraged to take breaks and get water as needed.   Affect/Mood: Appropriate   Participation Level: Engaged   Participation Quality: Independent   Behavior: Appropriate   Speech/Thought Process: Focused   Insight: Good   Judgement: Good   Modes of Intervention: Music and Exercise   Patient Response to Interventions:  Engaged   Education Outcome:  Acknowledges education   Clinical Observations/Individualized Feedback: Pt was attentive and engaged throughout group session. Pt was appropriate and interacted well with peers.    Plan: Continue to engage patient in RT group sessions 2-3x/week.   Jozelynn Danielson-McCall, LRT,CTRS 07/29/2022 12:22 PM

## 2022-07-29 NOTE — Progress Notes (Signed)
   07/29/22 2230  Psych Admission Type (Psych Patients Only)  Admission Status Involuntary  Psychosocial Assessment  Patient Complaints None  Eye Contact Fair  Facial Expression Flat  Affect Sad  Speech Logical/coherent  Interaction Cautious;Minimal  Motor Activity Fidgety  Appearance/Hygiene Unremarkable  Behavior Characteristics Guarded  Mood Preoccupied  Aggressive Behavior  Effect No apparent injury  Thought Process  Coherency WDL  Content Preoccupation  Delusions None reported or observed  Perception WDL  Hallucination None reported or observed  Judgment Poor  Confusion None  Danger to Self  Current suicidal ideation? Denies  Danger to Others  Danger to Others None reported or observed

## 2022-07-29 NOTE — Plan of Care (Signed)
Spoke to patient again this afternoon.  Patient was more open to conversing. He was pleasant and cooperative. No pressured speech noted and his thoughts seemed to no longer be racing compared to prior encounters. He was not arguing about taking his medication nor was he preoccupied with getting proof of medication efficacy or medication costs.  He was amenable to family meeting with his father as he says he plans to live with him once he is discharged from hospital. Patient did not want to involve mother in family meeting at this time.  Signed: Bary Leriche, MD [facility] Phys[facility] 07/29/2022 2:48 PM

## 2022-07-29 NOTE — Plan of Care (Signed)
°  Problem: Education: °Goal: Emotional status will improve °Outcome: Progressing °  °Problem: Activity: °Goal: Sleeping patterns will improve °Outcome: Progressing °  °Problem: Safety: °Goal: Periods of time without injury will increase °Outcome: Progressing °  °
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
## 2022-07-29 NOTE — Plan of Care (Signed)
  Problem: Education: Goal: Knowledge of [facility] Education information/materials will improve Outcome: Progressing   Problem: Activity: Goal: Interest or engagement in activities will improve Outcome: Progressing   Problem: Health Behavior/Discharge Planning: Goal: Identification of resources available to assist in meeting health care needs will improve Outcome: Progressing

## 2022-07-29 NOTE — [facility] Group Notes (Signed)

## 2022-07-29 NOTE — Group Note (Signed)
Date:  07/29/2022 Time:  9:45 AM  Group Topic/Focus:  Goals Group:   The focus of this group is to help patients establish daily goals to achieve during treatment and discuss how the patient can incorporate goal setting into their daily lives to aide in recovery.    Participation Level:  Active  Participation Quality:  Appropriate  Affect:  Appropriate  Cognitive:  Appropriate  Insight: Appropriate  Engagement in Group:  Limited  Modes of Intervention:  Discussion  Additional Comments:     Reymundo Poll 07/29/2022, 9:45 AM

## 2022-07-29 NOTE — Progress Notes (Signed)
   07/29/22 0804  Psych Admission Type (Psych Patients Only)  Admission Status Involuntary  Psychosocial Assessment  Patient Complaints None  Eye Contact Avoids  Facial Expression Flat  Affect Flat  Speech Logical/coherent  Interaction Minimal  Motor Activity Other (Comment) (unremarkable)  Appearance/Hygiene Unremarkable  Behavior Characteristics Guarded  Mood Preoccupied  Thought Process  Coherency WDL  Content Preoccupation  Delusions None reported or observed  Perception WDL  Hallucination None reported or observed  Judgment Poor  Confusion None  Danger to Self  Current suicidal ideation? Denies  Danger to Others  Danger to Others None reported or observed

## 2022-07-30 MED ORDER — POTASSIUM CHLORIDE CRYS ER 20 MEQ PO TBCR
20.0000 meq | EXTENDED_RELEASE_TABLET | Freq: Every day | ORAL | Status: DC
  Filled 2022-07-30 (×3): qty 1

## 2022-07-30 NOTE — Group Note (Signed)
Date:  07/30/2022 Time:  12:03 PM  LCSW Group Therapy Note   Group Date: @GROUPDATE @ Start Time: @GROUPSTARTTIME @ End Time: @GROUPENDTIME @   Type of Therapy and Topic:  Group Therapy: Boundaries  Participation Level:    Description of Group: This group will address the use of boundaries in their personal lives. Patients will explore why boundaries are important, the difference between healthy and unhealthy boundaries, and negative and postive outcomes of different boundaries and will look at how boundaries can be crossed.  Patients will be encouraged to identify current boundaries in their own lives and identify what kind of boundary is being set. Facilitators will guide patients in utilizing problem-solving interventions to address and correct types boundaries being used and to address when no boundary is being used. Understanding and applying boundaries will be explored and addressed for obtaining and maintaining a balanced life. Patients will be encouraged to explore ways to assertively make their boundaries and needs known to significant others in their lives, using other group members and facilitator for role play, support, and feedback.  Therapeutic Goals:  1.  Patient will identify areas in their life where setting clear boundaries could be  used to improve their life.  2.  Patient will identify signs/triggers that a boundary is not being respected. 3.  Patient will identify two ways to set boundaries in order to achieve balance in  their lives: 4.  Patient will demonstrate ability to communicate their needs and set boundaries  through discussion and/or role plays  Summary of Patient Progress:  Patient was active throughout the session and proved open to feedback from CSW and peers. Patient demonstrated insight into the subject matter, was respectful of peers, and was present throughout the entire session.  Therapeutic Modalities:   Cognitive Behavioral Therapy Solution-Focused  Therapy  Memory Dance Tyler Barr, LCSWA 07/30/2022  12:02 PM   Group Topic/Focus:  Emotional Education:   The focus of this group is to discuss what feelings/emotions are, and how they are experienced. Self Care:   The focus of this group is to help patients understand the importance of self-care in order to improve or restore emotional, physical, spiritual, interpersonal, and financial health.    Participation Level:  Active  Participation Quality:  Appropriate  Affect:  Appropriate  Cognitive:  Appropriate  Insight: Appropriate  Engagement in Group:  Engaged  Modes of Intervention:  Discussion, Exploration, Socialization, and Support  Additional Comments:  Tyler Barr was actively engaged, supportive to others, and stated that he felt "motivated" at the conclusion of group.  Memory Dance Tyler Barr 07/30/2022, 12:03 PM

## 2022-07-30 NOTE — Group Note (Signed)
Date:  07/30/2022 Time:  3:27 PM  Group Topic/Focus:  Wellness Toolbox:   The focus of this group is to discuss various aspects of wellness, balancing those aspects and exploring ways to increase the ability to experience wellness.  Patients will create a wellness toolbox for use upon discharge.    Participation Level:  Active  Participation Quality:  Appropriate  Affect:  Appropriate  Cognitive:  Appropriate  Insight: Appropriate  Engagement in Group:  Engaged  Modes of Intervention:  Education  Additional Comments:     Reymundo Poll 07/30/2022, 3:27 PM
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
## 2022-07-30 NOTE — Plan of Care (Signed)
  Problem: Education: Goal: Emotional status will improve Outcome: Progressing Goal: Mental status will improve Outcome: Progressing   Problem: Activity: Goal: Sleeping patterns will improve Outcome: Progressing   

## 2022-07-30 NOTE — Progress Notes (Signed)
   07/30/22 2030  Psych Admission Type (Psych Patients Only)  Admission Status Involuntary  Psychosocial Assessment  Patient Complaints None  Eye Contact Fair  Facial Expression Flat  Affect Sad  Speech Logical/coherent  Interaction Cautious;Minimal  Motor Activity Fidgety  Appearance/Hygiene Unremarkable  Behavior Characteristics Guarded  Mood Preoccupied  Aggressive Behavior  Effect No apparent injury  Thought Process  Coherency WDL  Content Preoccupation  Delusions None reported or observed  Perception WDL  Hallucination None reported or observed  Judgment Poor  Confusion None  Danger to Self  Current suicidal ideation? Denies  Danger to Others  Danger to Others None reported or observed

## 2022-07-30 NOTE — [facility] Group Notes (Signed)
Adult Psychoeducational Group Note  Date:  07/30/2022 Time:  10:39 PM  Group Topic/Focus:  Wrap-Up Group:   The focus of this group is to help patients review their daily goal of treatment and discuss progress on daily workbooks.  Participation Level:  Active  Participation Quality:  Appropriate  Affect:  Appropriate  Cognitive:  Appropriate  Insight: Appropriate  Engagement in Group:  Engaged  Modes of Intervention:  Discussion  Additional Comments:  Pt  stated day was 9 out of 10. Pt. Stated goal for today was to focus on having a great day and it was achieved.   Joselyn Arrow 07/30/2022, 10:39 PM

## 2022-07-30 NOTE — Progress Notes (Signed)
   07/30/22 0800  Psych Admission Type (Psych Patients Only)  Admission Status Involuntary  Psychosocial Assessment  Patient Complaints None  Eye Contact Fair  Facial Expression Flat  Affect Sad  Speech Logical/coherent  Interaction Cautious;Minimal  Motor Activity Fidgety  Appearance/Hygiene Unremarkable  Behavior Characteristics Guarded  Mood Preoccupied  Aggressive Behavior  Effect No apparent injury  Thought Process  Coherency WDL  Content Preoccupation  Delusions None reported or observed  Perception WDL  Hallucination None reported or observed  Judgment Poor  Confusion None  Danger to Self  Current suicidal ideation? Denies  Danger to Others  Danger to Others None reported or observed

## 2022-07-30 NOTE — Plan of Care (Signed)

## 2022-07-30 NOTE — Group Note (Signed)
Date:  07/30/2022 Time:  11:12 AM  Group Topic/Focus:  Goals Group:   The focus of this group is to help patients establish daily goals to achieve during treatment and discuss how the patient can incorporate goal setting into their daily lives to aide in recovery.    Participation Level:  Active  Participation Quality:  Appropriate  Affect:  Appropriate  Cognitive:  Appropriate  Insight: Appropriate  Engagement in Group:  Engaged  Modes of Intervention:  Discussion  Additional Comments:     Reymundo Poll 07/30/2022, 11:12 AM

## 2022-07-30 NOTE — Group Note (Signed)
Recreation Therapy Group Note   Group Topic:Animal Assisted Therapy   Group Date: 07/30/2022 Start Time: 1008 End Time: 1042 Facilitators: Yordin Rhoda-McCall, LRT,CTRS Location: [address] Dayroom   Animal-Assisted Activity (AAA) Program Checklist/Progress Notes Patient Eligibility Criteria Checklist & Daily Group note for Rec Tx Intervention  AAA/T Program Assumption of Risk Form signed by Patient/ or Parent Legal Guardian Yes  Patient is free of allergies or severe asthma Yes  Patient reports no fear of animals Yes  Patient reports no history of cruelty to animals Yes  Patient understands his/her participation is voluntary Yes  Patient washes hands before animal contact Yes  Patient washes hands after animal contact Yes    Affect/Mood: Appropriate   Participation Level: Active   Participation Quality: Independent   Behavior: Appropriate   Speech/Thought Process: Focused   Insight: Good   Judgement: Good   Modes of Intervention: Teaching laboratory technician   Patient Response to Interventions:  Engaged   Education Outcome:  Acknowledges education   Clinical Observations/Individualized Feedback: Patient attended session and interacted appropriately with therapy dog and peers. Patient asked appropriate questions about therapy dog and his training. Patient shared stories about their pets at home with group.    Plan: Continue to engage patient in RT group sessions 2-3x/week.   Kalicia Dufresne-McCall, LRT,CTRS 07/30/2022 12:27 PM

## 2022-07-31 MED ORDER — ARIPIPRAZOLE 10 MG PO TABS: 10.0000 mg | ORAL_TABLET | Freq: Every day | ORAL | 0 refills | Status: AC

## 2022-07-31 NOTE — Progress Notes (Signed)
Patient appears irritable. Patient denies SI/HI/AVH. Pt reports anxiety is 1/10 and depression is 1/10. Pt reports good sleep and good appetite. Patient refused potassium this morning. Pt was provided with education and potential risks if potassium lowers. Pt reported "I do not want to put unknown substances in my body, especially if it can affect my heart when I already have a condition." Pt reports he is leaving at 10am today and has completed his suicide safety plan. Patient remains safe on Q80min checks and contracts for safety.      07/31/22 0847  Psych Admission Type (Psych Patients Only)  Admission Status Involuntary  Psychosocial Assessment  Patient Complaints None  Eye Contact Fair  Facial Expression Flat  Affect Blunted  Speech Logical/coherent  Interaction Cautious;Minimal  Motor Activity Fidgety  Appearance/Hygiene Unremarkable  Behavior Characteristics Guarded  Mood Preoccupied  Thought Process  Coherency WDL  Content Preoccupation  Delusions None reported or observed  Perception WDL  Hallucination None reported or observed  Judgment Poor  Confusion None  Danger to Self  Current suicidal ideation? Denies  Danger to Others  Danger to Others None reported or observed

## 2022-07-31 NOTE — Progress Notes (Signed)
Pt was educated on discharge. Pt was given discharge papers. Copy of safety plan placed in chart. Pt was satisfied all belongings were returned. Pt was discharged to lobby.  

## 2022-07-31 NOTE — Discharge Instructions (Signed)
Dear Tyler Barr,  It was a pleasure to take care of you during your stay at [facility] where you were treated for your Bipolar 1 disorder [facility]).  While you were here, you were:  observed and cared for by our nurses and nursing assistants  treated with medications by your psychiatrists  provided individual and group therapy by therapists  provided resources by our social workers and case managers  Please review the medication list provided to you at discharge and stop, start taking, or continue taking the medications listed there.  You should also follow-up with your primary care doctor, or start seeing one if you don't have one yet. If applicable, here are some scheduled follow-ups for you:  Follow-up Information     [facility] Follow up on 08/09/2022.   Why: You have a hospital follow up appointment for therapy and management services on 08/09/22 at 8:00 am.  This will be a Virtual telehealth appt. Contact information: [address]  [address] [place] [address] [phone]                  I recommend abstinence from alcohol, tobacco, and other illicit drug use.   If your psychiatric symptoms or suicidal thoughts recur, worsen, or if you have side effects to your psychiatric medications, call your outpatient psychiatric provider, 911, [phone] or go to the nearest emergency department.  Take care!  Signed: Augusto Gamble, MD 07/31/2022, 7:18 AM  Naloxone (Narcan) can help reverse an overdose when given to the victim quickly.  [facility] offers free naloxone kits and instructions/training on its use.  Add naloxone to your first aid kit and you can help save a life. A prescription can be filled at your local pharmacy or free kits are provided by the county.  Pick up your free kit at the following locations:   [place]:  [facility], [address] [place] [address] [phone]) [facility] [address] [place] [id number] [phone]) [facility] [address] [place] [address]  [place]: [facility] [address] [place] [address] ([phone]) [facility] [address] [place] [address] [phone])

## 2022-07-31 NOTE — Plan of Care (Signed)

## 2022-07-31 NOTE — [facility] Group Notes (Signed)
Adult Psychoeducational Group Note  Date:  07/31/2022 Time:  10:26 AM  Group Topic/Focus:  Emotional Education:   The focus of this group is to discuss what feelings/emotions are, and how they are experienced. Goals Group:   The focus of this group is to help patients establish daily goals to achieve during treatment and discuss how the patient can incorporate goal setting into their daily lives to aide in recovery.  Participation Level:  Active  Participation Quality:  Attentive  Affect:  Appropriate  Cognitive:  Alert  Insight: Appropriate  Engagement in Group:  Engaged  Modes of Intervention:  Education and Exploration  Additional Comments:  Pt participated in group today. Pt stated his goal is to have a good mental day. Facilitator educated the group on the Four Cues emotional, behavioral, physical and cognitive engaging the group in identifying their cues. Facilitator read a spiritual quote for today.     Valita Righter 07/31/2022, 10:26 AM
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
## 2022-07-31 NOTE — Progress Notes (Signed)
  [facility] Adult Case Management Discharge Plan :  Will you be returning to the same living situation after discharge:  No. Audy Dauphine [phone]) At discharge, do you have transportation home?: Yes,  Father Do you have the ability to pay for your medications: Yes,  Insured   Release of information consent forms completed and in the chart;  Patient's signature needed at discharge.  Patient to Follow up at:  Follow-up Information     [facility] Follow up on 08/09/2022.   Why: You have a hospital follow up appointment for therapy and management services on 08/09/22 at 8:00 am.  This will be a Virtual telehealth appt. Contact information: [address]  [address] [place] [address] [phone]                 Next level of care provider has access to [facility] Link:no  Safety Planning and Suicide Prevention discussed: Kareen, H[place]man [phone])     Has patient been referred to the Quitline?: Patient does not use tobacco/nicotine products  Patient has been referred for addiction treatment: Yes, the patient will follow up with an outpatient provider for substance use disorder. Psychiatrist/APP: appointment made and Therapist: appointment made Patient to continue working towards treatment goals after discharge. Patient no longer meets criteria for inpatient criteria per attending physician. Continue taking medications as prescribed, nursing to provide instructions at discharge. Follow up with all scheduled appointments.   Aren Pryde S Staci Dack, LCSW 07/31/2022, 11:06 AM

## 2022-07-31 NOTE — [facility] Suicide Risk Assessment (Signed)
[facility] Discharge Suicide Risk Assessment  Principal Problem: Bipolar 1 disorder [facility]) Discharge Diagnoses: Principal Problem:   Bipolar 1 disorder (HCC) Active Problems:   Cannabis use disorder, mild, abuse   Reason for Admission:  Tyler Barr is a 22 y.o., male with a documented past psychiatric history of bipolar I disorder and substance use history of cannabis use disorder who presents to the [facility] Involuntary from [facility] for evaluation and management of concerns for suicidal ideation   Hospital Summary During the patient's hospitalization, patient had extensive initial psychiatric evaluation, and follow-up psychiatric evaluations every day.   Psychiatric diagnoses provided upon initial assessment: Principal Problem:   Bipolar 1 disorder (HCC) Active Problems:   Cannabis use disorder, mild, abuse   The following medications were changed during the hospital admission: -- aripiprazole was increased to 10 mg   During the hospitalization, patient had the following lab / imaging / testing abnormalities which require further evaluation / management / treatment: none   Patient's care was discussed during the interdisciplinary team meeting every day during the hospitalization.   The patient denies any side effects to prescribed psychiatric medication.   Tyler Barr is a 22 y.o., male with a documented past psychiatric history of bipolar I disorder and substance use history of cannabis use disorder who presents to the [facility] Involuntary from [facility] for evaluation and management of concerns for suicidal ideation    Gradually, patient started adjusting to milieu. The patient was evaluated each day by a clinical provider to ascertain response to treatment. Improvement was noted by the patient's report of decreasing symptoms, improved sleep and appetite, affect, medication tolerance, behavior, and participation in unit programming.  Patient was asked  each day to complete a self inventory noting mood, mental status, pain, new symptoms, anxiety and concerns.     Symptoms were reported as significantly decreased or resolved completely by discharge.    On day of discharge, the patient reports that their mood is stable. The patient denied having suicidal thoughts for more than 48 hours prior to discharge.  Patient denies having homicidal thoughts.  Patient denies having auditory hallucinations.  Patient denies any visual hallucinations or other symptoms of psychosis. The patient was motivated to continue taking medication with a goal of continued improvement in mental health.    The patient reports their target psychiatric symptoms of hypomania / mania responded well to the psychiatric medications, and the patient reports overall benefit other psychiatric hospitalization. Supportive psychotherapy was provided to the patient. The patient also participated in regular group therapy while hospitalized. Coping skills, problem solving as well as relaxation therapies were also part of the unit programming.   Labs were reviewed with the patient, and abnormal results were discussed with the patient.   The patient is able to verbalize their individual safety plan to this provider.   Behavioral Events: none   Restraints: none   # It is recommended to the patient to continue psychiatric medications as prescribed, after discharge from the hospital.     # It is recommended to the patient to follow up with your outpatient psychiatric provider and PCP.   # It was discussed with the patient, the impact of alcohol, drugs, tobacco have been there overall psychiatric and medical wellbeing, and total abstinence from substance use was recommended to the patient.   # Prescriptions provided or sent directly to preferred pharmacy at discharge. Patient agreeable to plan. Given opportunity to ask questions. Appears to feel comfortable  with discharge.    # In the event  of worsening symptoms, the patient is instructed to call the crisis hotline, 911 and or go to the nearest ED for appropriate evaluation and treatment of symptoms. To follow-up with primary care provider for other medical issues, concerns and or health care needs  Total Time spent with patient: 1.5 hours  Psychiatric Specialty Exam: General Appearance:  Appropriate for Environment; Fairly Groomed    Eye Contact:  Good    Speech:  Clear and Coherent    Volume:  Normal    Mood:  Euthymic    Affect:  Appropriate; Congruent; Full Range    Thought Content:  WDL    Suicidal Thoughts:  Suicidal Thoughts: No    Homicidal Thoughts:  Homicidal Thoughts: No    Thought Process:  Coherent; Goal Directed; Linear    Orientation:  Full (Time, Place and Person)      Memory:  Immediate Good; Recent Good; Remote Good    Judgment:  Good    Insight:  Good    Concentration:  Good    Recall:  Good    Fund of Knowledge:  Good    Language:  Good    Psychomotor Activity:  Psychomotor Activity: Normal    Assets:  Desire for Improvement    Sleep:  Sleep: Good      Review of Systems: Review of Systems  Constitutional: Negative.   Respiratory: Negative.    Cardiovascular: Negative.   Gastrointestinal: Negative.   Genitourinary: Negative.       Vital signs: Blood pressure 139/80, pulse 78, temperature 98.2 F (36.8 C), temperature source Oral, resp. rate 16, height 6\' 3"  (1.905 m), weight 97.7 kg, SpO2 98%. Body mass index is 26.92 kg/m. Physical Exam Vitals and nursing note reviewed.  Constitutional:      Appearance: Normal appearance.  HENT:     Head: Normocephalic and atraumatic.  Pulmonary:     Effort: Pulmonary effort is normal.  Neurological:     General: No focal deficit present.     Mental Status: He is alert.   Mental Status Per Nursing Assessment::   On Admission:  NA  Demographic Factors:  Male and Adolescent or young adult  Loss  Factors: Financial problems/change in socioeconomic status  Historical Factors: NA  Risk Reduction Factors:   Living with another person, especially a relative and Positive social support  Continued Clinical Symptoms:  More than one psychiatric diagnosis  Cognitive Features That Contribute To Risk:  None    Suicide Risk:  Acute Risk: Mild: There are no identifiable suicide plans, no associated intent, mild dysphoria and related symptoms, good self-control (both objective and subjective assessment), few other risk factors, and identifiable protective factors, including available and accessible social support.   Follow-up Information     [facility] Follow up on 08/09/2022.   Why: You have a hospital follow up appointment for therapy and management services on 08/09/22 at 8:00 am.  This will be a Virtual telehealth appt. Contact information: [address]  [address] [place] [address] [phone]                 Plan Of Care/Follow-up recommendations:  Activity: as tolerated  Diet: heart healthy  Other: -Follow-up with your outpatient psychiatric provider -instructions on appointment date, time, and address (location) are provided to you in discharge paperwork.  -Take your psychiatric medications as prescribed at discharge - instructions are provided to you in the discharge paperwork  -Follow-up with  outpatient primary care doctor and other specialists -for management of chronic medical disease, including: health maintenance checks  -Testing: Follow-up with outpatient provider for abnormal lab results or labs needing follow-up: none  -Recommend abstinence from alcohol, tobacco, and other illicit drug use at discharge.   # In the event of worsening symptoms, the patient is instructed to call the crisis hotline, 911, and or go to the nearest ED for appropriate evaluation and treatment of symptoms. To follow-up with primary care provider for other medical issues,  concerns and or health care needs  -If suicidal thoughts recur, call your outpatient psychiatric provider, 911, 988 or go to the nearest emergency department.  Signed: Augusto Gamble, MD 07/31/2022, 7:29 AM
